# Patient Record
Sex: Female | Born: 1950 | Race: White | Hispanic: No | Marital: Married | Smoking: Never smoker
Health system: Southern US, Community
[De-identification: ages and names within clinical notes are randomized; demographics above are authoritative.]

## PROBLEM LIST (undated history)

## (undated) DIAGNOSIS — R51 Headache: Secondary | ICD-10-CM

## (undated) DIAGNOSIS — Z8739 Personal history of other diseases of the musculoskeletal system and connective tissue: Secondary | ICD-10-CM

## (undated) DIAGNOSIS — G43909 Migraine, unspecified, not intractable, without status migrainosus: Secondary | ICD-10-CM

## (undated) DIAGNOSIS — I00 Rheumatic fever without heart involvement: Secondary | ICD-10-CM

## (undated) DIAGNOSIS — N02B9 Other recurrent and persistent immunoglobulin A nephropathy: Secondary | ICD-10-CM

## (undated) DIAGNOSIS — B1910 Unspecified viral hepatitis B without hepatic coma: Secondary | ICD-10-CM

## (undated) DIAGNOSIS — K219 Gastro-esophageal reflux disease without esophagitis: Secondary | ICD-10-CM

## (undated) DIAGNOSIS — R011 Cardiac murmur, unspecified: Secondary | ICD-10-CM

## (undated) DIAGNOSIS — R519 Headache, unspecified: Secondary | ICD-10-CM

## (undated) DIAGNOSIS — M1990 Unspecified osteoarthritis, unspecified site: Secondary | ICD-10-CM

## (undated) DIAGNOSIS — N028 Recurrent and persistent hematuria with other morphologic changes: Secondary | ICD-10-CM

## (undated) DIAGNOSIS — F419 Anxiety disorder, unspecified: Secondary | ICD-10-CM

## (undated) DIAGNOSIS — I10 Essential (primary) hypertension: Secondary | ICD-10-CM

## (undated) HISTORY — PX: ERCP: SHX60

## (undated) HISTORY — PX: LAPAROSCOPIC CHOLECYSTECTOMY: SUR755

## (undated) HISTORY — PX: DILATION AND CURETTAGE OF UTERUS: SHX78

---
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
## 2012-12-31 ENCOUNTER — Emergency Department (HOSPITAL_COMMUNITY)
Admission: EM | Admit: 2012-12-31 | Discharge: 2012-12-31 | Disposition: A | Discharge status: Home / Self Care | Payer: BC Managed Care – PPO | Attending: Emergency Medicine | Admitting: Emergency Medicine

## 2012-12-31 ENCOUNTER — Emergency Department (HOSPITAL_COMMUNITY): Payer: BC Managed Care – PPO

## 2012-12-31 ENCOUNTER — Encounter (HOSPITAL_COMMUNITY): Payer: Self-pay | Admitting: Emergency Medicine

## 2012-12-31 DIAGNOSIS — I10 Essential (primary) hypertension: Secondary | ICD-10-CM | POA: Insufficient documentation

## 2012-12-31 DIAGNOSIS — R002 Palpitations: Secondary | ICD-10-CM | POA: Insufficient documentation

## 2012-12-31 DIAGNOSIS — Z79899 Other long term (current) drug therapy: Secondary | ICD-10-CM | POA: Insufficient documentation

## 2012-12-31 DIAGNOSIS — Z87448 Personal history of other diseases of urinary system: Secondary | ICD-10-CM | POA: Insufficient documentation

## 2012-12-31 DIAGNOSIS — Z8679 Personal history of other diseases of the circulatory system: Secondary | ICD-10-CM | POA: Insufficient documentation

## 2012-12-31 HISTORY — DX: Other recurrent and persistent immunoglobulin A nephropathy: N02.B9

## 2012-12-31 HISTORY — DX: Essential (primary) hypertension: I10

## 2012-12-31 HISTORY — DX: Rheumatic fever without heart involvement: I00

## 2012-12-31 HISTORY — DX: Recurrent and persistent hematuria with other morphologic changes: N02.8

## 2012-12-31 LAB — URINE MICROSCOPIC-ADD ON

## 2012-12-31 LAB — BASIC METABOLIC PANEL
BUN: 14 mg/dL (ref 6–23)
CO2: 28 mEq/L (ref 19–32)
Calcium: 9.6 mg/dL (ref 8.4–10.5)
Chloride: 98 mEq/L (ref 96–112)
Creatinine, Ser: 0.56 mg/dL (ref 0.50–1.10)
GFR calc Af Amer: >90 mL/min (ref >90)
GFR calc non Af Amer: >90 mL/min (ref >90)
Glucose, Bld: 132 mg/dL — ABNORMAL HIGH (ref 70–99)
Potassium: 3.7 mEq/L (ref 3.5–5.1)
Sodium: 137 mEq/L (ref 135–145)

## 2012-12-31 LAB — CBC
HCT: 42.2 % (ref 36.0–46.0)
Hemoglobin: 14.7 g/dL (ref 12.0–15.0)
MCH: 30.7 pg (ref 26.0–34.0)
MCHC: 34.8 g/dL (ref 30.0–36.0)
MCV: 88.1 fL (ref 78.0–100.0)
Platelets: 208 10*3/uL (ref 150–400)
RBC: 4.79 MIL/uL (ref 3.87–5.11)
RDW: 12.7 % (ref 11.5–15.5)
WBC: 9.7 10*3/uL (ref 4.0–10.5)

## 2012-12-31 LAB — URINALYSIS, ROUTINE W REFLEX MICROSCOPIC
Bilirubin Urine: NEGATIVE
Glucose, UA: NEGATIVE mg/dL
Ketones, ur: NEGATIVE mg/dL
Leukocytes, UA: NEGATIVE
Nitrite: NEGATIVE
Protein, ur: 100 mg/dL — AB
Specific Gravity, Urine: 1.006 (ref 1.005–1.030)
Urobilinogen, UA: 0.2 mg/dL (ref 0.0–1.0)
pH: 7.5 (ref 5.0–8.0)

## 2012-12-31 NOTE — ED Notes (Signed)
Pt from home c/o HTN. 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 Has had recent fluctuation in BP last 2 weeks. This AM highest BP 208/84. Pt has taken multiple Medication to lower BP. Denies Pain, headache, N/V

## 2013-01-02 NOTE — ED Provider Notes (Signed)
History     CSN: [id number]  Arrival date & time 12/31/12  4098   First MD Initiated Contact with Patient 12/31/12 [phone]      Chief Complaint  Patient presents with  . Hypertension    (Consider location/radiation/quality/duration/timing/severity/associated sxs/prior treatment) HPI Patient presents emergency Department with heart palpitations, and high blood pressure.  Patient, states she's been having fluctuations in her blood pressure over the last several weeks.  Patient, states she's not had chest pain, shortness of breath, nausea, vomiting, diarrhea, headache, visual changes, decreased urine output, numbness, syncope, or dizziness.  Patient, states, that these extra beat/palpitations seemed to wake her up at night.  She states that when she was taking her bystolic at bedtime she did not have this issue.  Her doctor changed some of her medications and times for her medication administration.  Patient, states that she came to [place] from [place], [place], do to concerns for the ER in [place].  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  Past Medical History  Diagnosis Date  . IgAN (IgA nephropathy)   . Hypertension   . Rheumatic fever     Past Surgical History  Procedure Laterality Date  . Cholecystectomy    . Ercp      History reviewed. No pertinent family history.  History  Substance Use Topics  . Smoking status: Never Smoker   . Smokeless tobacco: Never Used  . Alcohol Use: No    OB History   Grav Para Term Preterm Abortions TAB SAB Ect Mult Living                  Review of Systems All other systems negative except as documented in the HPI. All pertinent positives and negatives as reviewed in the HPI.  Allergies  Flagyl; Statins; and Sulfa antibiotics  Home Medications   Current Outpatient Rx  Name  Route  Sig  Dispense  Refill  . acetaminophen (TYLENOL) 500 MG tablet   Oral   Take 1,000  mg by mouth every 6 (six) hours as needed for pain.         Marland Kitchen b complex vitamins tablet   Oral   Take 1 tablet by mouth daily.         . benazepril (LOTENSIN) 40 MG tablet   Oral   Take 40 mg by mouth daily.         . cloNIDine (CATAPRES) 0.1 MG tablet   Oral   Take 0.1 mg by mouth 2 (two) times daily.         . Coenzyme Q10 (COQ-10) 200 MG CAPS   Oral   Take 1 capsule by mouth daily.         Marland Kitchen CRANBERRY PO   Oral   Take 1 tablet by mouth daily.         . hydrochlorothiazide (HYDRODIURIL) 50 MG tablet   Oral   Take 50 mg by mouth once.         Marland Kitchen LORazepam (ATIVAN) 0.5 MG tablet   Oral   Take 0.5 mg by mouth 2 (two) times daily as needed for anxiety.         Marland Kitchen losartan-hydrochlorothiazide (HYZAAR) 100-25 MG per tablet   Oral   Take 1 tablet by mouth daily.         . magnesium oxide (MAG-OX) 400 MG tablet   Oral   Take 400 mg by mouth daily.         Marland Kitchen  nebivolol (BYSTOLIC) 5 MG tablet   Oral   Take 5 mg by mouth daily.         Marland Kitchen NIFEdipine (PROCARDIA-XL/ADALAT-CC/NIFEDICAL-XL) 30 MG 24 hr tablet   Oral   Take 60 mg by mouth once.         Marland Kitchen omega-3 acid ethyl esters (LOVAZA) 1 G capsule   Oral   Take 6 g by mouth 2 (two) times daily.         . Vitamin D, Ergocalciferol, (DRISDOL) 50000 UNITS CAPS   Oral   Take 50,000 Units by mouth every 7 (seven) days. Takes on Wednesday         . vitamin E 400 UNIT capsule   Oral   Take 400 Units by mouth daily.           BP 126/60  Pulse 68  Temp(Src) 97.9 F (36.6 C) (Oral)  Resp 18  SpO2 97%  Physical Exam  Nursing note and vitals reviewed. Constitutional: She is oriented to person, place, and time. She appears well-developed and well-nourished. No distress.  HENT:  Head: Normocephalic and atraumatic.  Mouth/Throat: Oropharynx is clear and moist. No oropharyngeal exudate.  Eyes: Pupils are equal, round, and reactive to light.  Neck: Normal range of motion. Neck supple.   Cardiovascular: Normal rate, regular rhythm, normal heart sounds, intact distal pulses and normal pulses.  Frequent extrasystoles are present. Exam reveals no gallop and no friction rub.   No murmur heard. Pulmonary/Chest: Effort normal and breath sounds normal. No respiratory distress.  Abdominal: Soft. Bowel sounds are normal. She exhibits no distension. There is no tenderness.  Neurological: She is alert and oriented to person, place, and time. No cranial nerve deficit. She exhibits normal muscle tone. Coordination normal.  Skin: Skin is warm and dry. She is not diaphoretic.    ED Course  Procedures (including critical care time)  Labs Reviewed  BASIC METABOLIC PANEL - Abnormal; Notable for the following:    Glucose, Bld 132 (*)    All other components within normal limits  URINALYSIS, ROUTINE W REFLEX MICROSCOPIC - Abnormal; Notable for the following:    Hgb urine dipstick TRACE (*)    Protein, ur 100 (*)    All other components within normal limits  CBC  URINE MICROSCOPIC-ADD ON   Dg Chest 2 View  12/31/2012  *RADIOLOGY REPORT*  Clinical Data: Heart palpitations; hypertension  CHEST - 2 VIEW  Comparison: None.  Findings: Lungs clear.  Heart size and pulmonary vascularity are normal.  No adenopathy.  There is degenerative change in the thoracic spine.  IMPRESSION: No edema or consolidation.   Original Report Authenticated By: Bretta Bang, M.D.      1. Hypertension   2. Palpitations     Filed Vitals:   12/31/12 0936 12/31/12 1000 12/31/12 1100 12/31/12 1209  BP: 120/42 113/61 123/62 126/60  Pulse: 69 68 65 68  Temp:    97.9 F (36.6 C)  TempSrc:    Oral  Resp: 20   18  SpO2: 97% 95% 97% 97%   I spoke with the patient at great length about followup with cardiology and her primary doctor about these issues with her blood pressure.  Also advised the patient that she should only take her blood pressure one to 2 times a day at different times.  Patient is advised that the  more she takes her blood pressure more tends to go up.  The patient's blood pressure here has stabilized and then  within normal ranges over the last several hour.  Patient is advised to return here for any worsening in her condition.  The patient has been stable here in the emergency department spoke with cardiology about the patient and they reviewed her chart and findings.  They said they will see her in their office in the next one to 2 days. The patient has no chest pain or shortness of breath. The patient does appear anxious about her blood pressure. The patient is waking up at night with palpitations and checking her BP. She also is adding doses of BP meds. I advised that she needs to consistantly take her medications and not take it randomly and more than prescribed.  MDM  MDM Reviewed: nursing note and vitals Interpretation: labs, ECG and x-ray    Date: 01/05/2013  Rate:78  Rhythm: normal sinus rhythm  QRS Axis: normal  Intervals: normal  ST/T Wave abnormalities: normal  Conduction Disutrbances:first-degree A-V block , left anterior fascicular block and PVCs  Narrative Interpretation:   Old EKG Reviewed: none available           Carlyle Dolly, PA-C 01/05/13 1037

## 2013-01-03 ENCOUNTER — Encounter (HOSPITAL_COMMUNITY): Payer: Self-pay | Admitting: *Deleted

## 2013-01-03 ENCOUNTER — Observation Stay (HOSPITAL_COMMUNITY)
Admission: EM | Admit: 2013-01-03 | Discharge: 2013-01-05 | Disposition: A | Discharge status: Home / Self Care | Payer: BC Managed Care – PPO | Attending: Family Medicine | Admitting: Family Medicine

## 2013-01-03 ENCOUNTER — Other Ambulatory Visit: Payer: Self-pay

## 2013-01-03 DIAGNOSIS — R1013 Epigastric pain: Secondary | ICD-10-CM | POA: Insufficient documentation

## 2013-01-03 DIAGNOSIS — Z8679 Personal history of other diseases of the circulatory system: Secondary | ICD-10-CM | POA: Diagnosis present

## 2013-01-03 DIAGNOSIS — I160 Hypertensive urgency: Secondary | ICD-10-CM | POA: Diagnosis present

## 2013-01-03 DIAGNOSIS — Z79899 Other long term (current) drug therapy: Secondary | ICD-10-CM | POA: Insufficient documentation

## 2013-01-03 DIAGNOSIS — K3189 Other diseases of stomach and duodenum: Secondary | ICD-10-CM | POA: Insufficient documentation

## 2013-01-03 DIAGNOSIS — R5383 Other fatigue: Secondary | ICD-10-CM | POA: Insufficient documentation

## 2013-01-03 DIAGNOSIS — N059 Unspecified nephritic syndrome with unspecified morphologic changes: Secondary | ICD-10-CM | POA: Insufficient documentation

## 2013-01-03 DIAGNOSIS — I10 Essential (primary) hypertension: Secondary | ICD-10-CM | POA: Insufficient documentation

## 2013-01-03 DIAGNOSIS — R079 Chest pain, unspecified: Principal | ICD-10-CM | POA: Insufficient documentation

## 2013-01-03 DIAGNOSIS — Z8619 Personal history of other infectious and parasitic diseases: Secondary | ICD-10-CM

## 2013-01-03 DIAGNOSIS — R5381 Other malaise: Secondary | ICD-10-CM | POA: Insufficient documentation

## 2013-01-03 LAB — COMPREHENSIVE METABOLIC PANEL
Alkaline Phosphatase: 49 U/L (ref 39–117)
BUN: 22 mg/dL (ref 6–23)
GFR calc Af Amer: >90 mL/min (ref >90)
Glucose, Bld: 118 mg/dL — ABNORMAL HIGH (ref 70–99)
Potassium: 3.9 mEq/L (ref 3.5–5.1)
Total Protein: 7.5 g/dL (ref 6.0–8.3)

## 2013-01-03 LAB — PRO B NATRIURETIC PEPTIDE: Pro B Natriuretic peptide (BNP): 148.8 pg/mL — ABNORMAL HIGH (ref 0–125)

## 2013-01-03 LAB — POCT I-STAT TROPONIN I: Troponin i, poc: 0 ng/mL (ref 0.00–0.08)

## 2013-01-03 LAB — CBC
HCT: 42.6 % (ref 36.0–46.0)
Hemoglobin: 15 g/dL (ref 12.0–15.0)
MCH: 30.7 pg (ref 26.0–34.0)
MCHC: 35.2 g/dL (ref 30.0–36.0)

## 2013-01-03 MED ORDER — ASPIRIN 81 MG PO CHEW
324.0000 mg | CHEWABLE_TABLET | Freq: Once | ORAL | Status: AC
  Administered 2013-01-03: 324 mg via ORAL
  Filled 2013-01-03: qty 4

## 2013-01-03 NOTE — ED Provider Notes (Signed)
History     CSN: [id number]  Arrival date & time 01/03/13  2249   First MD Initiated Contact with Patient 01/03/13 2323      Chief Complaint  Patient presents with  . Chest Pain  . Dizziness  . Shortness of Breath    (Consider location/radiation/quality/duration/timing/severity/associated sxs/prior treatment) HPI 62 year old female presents to emergency department with complaint of elevated blood pressure, intermittent chest pressure and pain throughout the day, shortness of breath. Patient was seen in the emergency department 3 days ago for complaint of elevated blood pressure. She and her husband report her blood pressure has been well managed until December, when she began to have palpitations. Since that time her nephrologist has been rearranging her blood pressure medicines.she was recommended to follow-up with a cardiologist, and has an appointment March 5. She reports today around 3 PM she had sharp stabbing pains in her left chest associated with a fullness feeling. She also complains of intermittent chest pressure throughout the day. Patient reports intermittent hot feeling and tightness. Around 9 PM, patient with worsening tightness in her chest. In route here, she had onset of left flank pain and left neck pain radiating into her jaw. No pain at present. Patient has a half-brother is history of coronary disease.  She does not smoke.  Patient has history of hypertension, and IgA nephropathy. Past Medical History  Diagnosis Date  . IgAN (IgA nephropathy)   . Hypertension   . Rheumatic fever     Past Surgical History  Procedure Laterality Date  . Cholecystectomy    . Ercp      No family history on file.  History  Substance Use Topics  . Smoking status: Never Smoker   . Smokeless tobacco: Never Used  . Alcohol Use: No    OB History   Grav Para Term Preterm Abortions TAB SAB Ect Mult Living                  Review of Systems  Constitutional: Positive for fatigue.  Negative for diaphoresis.  Respiratory: Positive for chest tightness and shortness of breath. Negative for cough, choking and wheezing.   Neurological: Positive for dizziness and light-headedness.  All other systems reviewed and are negative.    Allergies  Flagyl; Statins; and Sulfa antibiotics  Home Medications   Current Outpatient Rx  Name  Route  Sig  Dispense  Refill  . b complex vitamins tablet   Oral   Take 1 tablet by mouth daily.         . benazepril (LOTENSIN) 40 MG tablet   Oral   Take 40 mg by mouth daily.         . cloNIDine (CATAPRES) 0.1 MG tablet   Oral   Take 0.1 mg by mouth 2 (two) times daily.         . Coenzyme Q10 (COQ-10) 200 MG CAPS   Oral   Take 1 capsule by mouth daily.         Marland Kitchen CRANBERRY PO   Oral   Take 1 tablet by mouth daily.         . hydrALAZINE (APRESOLINE) 25 MG tablet   Oral   Take 25 mg by mouth 2 (two) times daily.         Marland Kitchen losartan-hydrochlorothiazide (HYZAAR) 100-25 MG per tablet   Oral   Take 1 tablet by mouth daily.         . Magnesium 250 MG TABS   Oral  Take 250 mg by mouth 2 (two) times daily.         . nebivolol (BYSTOLIC) 5 MG tablet   Oral   Take 5 mg by mouth daily.         Marland Kitchen NIFEdipine (PROCARDIA-XL/ADALAT-CC/NIFEDICAL-XL) 30 MG 24 hr tablet   Oral   Take 60 mg by mouth once.         Marland Kitchen omega-3 acid ethyl esters (LOVAZA) 1 G capsule   Oral   Take 6 g by mouth 2 (two) times daily.         Marland Kitchen OVER THE COUNTER MEDICATION   Oral   Take 450 mg by mouth daily. Tumeric         . Vitamin D, Ergocalciferol, (DRISDOL) 50000 UNITS CAPS   Oral   Take 50,000 Units by mouth every 7 (seven) days. Takes on Wednesday         . vitamin E 400 UNIT capsule   Oral   Take 400 Units by mouth daily.           BP 182/79  Pulse 71  Temp(Src) 98.2 F (36.8 C) (Oral)  Resp 16  SpO2 97%  Physical Exam  Nursing note and vitals reviewed. Constitutional: She is oriented to person, place, and  time. She appears distressed (anxious appearing).  Obese female  HENT:  Head: Normocephalic and atraumatic.  Nose: Nose normal.  Mouth/Throat: Oropharynx is clear and moist.  Eyes: Conjunctivae and EOM are normal. Pupils are equal, round, and reactive to light.  Neck: Normal range of motion. Neck supple. No JVD present. No tracheal deviation present. No thyromegaly present.  Cardiovascular: Normal rate, regular rhythm, normal heart sounds and intact distal pulses.  Exam reveals no gallop and no friction rub.   No murmur heard. Hypertension noted  Pulmonary/Chest: Effort normal and breath sounds normal. No stridor. No respiratory distress. She has no wheezes. She has no rales. She exhibits no tenderness.  Abdominal: Soft. Bowel sounds are normal. She exhibits no distension and no mass. There is no tenderness. There is no rebound and no guarding.  Musculoskeletal: Normal range of motion. She exhibits no edema and no tenderness.  Lymphadenopathy:    She has no cervical adenopathy.  Neurological: She is alert and oriented to person, place, and time. She exhibits normal muscle tone. Coordination normal.  Skin: Skin is warm and dry. No rash noted. She is not diaphoretic. No erythema. No pallor.  Psychiatric:  anxious    ED Course  Procedures (including critical care time)  Labs Reviewed  CBC - Abnormal; Notable for the following:    WBC 10.6 (*)    All other components within normal limits  PRO B NATRIURETIC PEPTIDE  COMPREHENSIVE METABOLIC PANEL  POCT I-STAT TROPONIN I   Dg Chest 2 View  01/04/2013  *RADIOLOGY REPORT*  Clinical Data: Chest pain, shortness of breath  CHEST - 2 VIEW  Comparison: 12/31/2012  Findings: Unchanged cardiomediastinal contours, within normal range.  No confluent airspace opacity. Minimal linear opacity at the left lung base, in keeping with atelectasis. No pleural effusion or pneumothorax.  Multilevel degenerative changes.  IMPRESSION: No acute process identified  by radiograph.   Original Report Authenticated By: Jearld Lesch, M.D.      Date: 01/03/2013  Rate:79  Rhythm: normal sinus rhythm  QRS Axis: left  Intervals: PR prolonged  ST/T Wave abnormalities: normal  Conduction Disutrbances:first-degree A-V block  and left anterior fascicular block  Narrative Interpretation: LVH, pvcs resolved from  prior  Old EKG Reviewed: changes noted    1. Chest pain radiating to jaw   2. Hypertension       MDM  62 year old female with difficult to control hypertension, today with chest pains. EKG without ST elevation. EKG during last visit did have several PVCs, none seen today. Patient on multiple blood pressure medicationshim a currently weaning off clonidine but taking  Both an ACE inhibitor and an ARB along with hydrochlorothiazide, bistolic and nifedipine.      1:12 AM Patient reports pain has resolved. She reports symptoms improved after nitroglycerin. Blood pressure is much improved. Will discuss with hospitalist for chest pain rule out admission.  Olivia Mackie, MD 01/04/13 [id number]

## 2013-01-03 NOTE — ED Notes (Addendum)
~   1 hr. Lt. Side Lower back pain, then started having Lt. Side cp, diaphoresis, sob, dizzy. D/c from this hospital on Monday for having flushed feeling, and shakiness. "feels like heart was going to stop."  BP 200/90. Taking Claritin for 3 weeks. Took bystolic an hour ago.

## 2013-01-04 ENCOUNTER — Encounter (HOSPITAL_COMMUNITY): Payer: Self-pay | Admitting: Internal Medicine

## 2013-01-04 ENCOUNTER — Emergency Department (HOSPITAL_COMMUNITY): Payer: BC Managed Care – PPO

## 2013-01-04 DIAGNOSIS — I359 Nonrheumatic aortic valve disorder, unspecified: Secondary | ICD-10-CM

## 2013-01-04 DIAGNOSIS — I10 Essential (primary) hypertension: Secondary | ICD-10-CM | POA: Diagnosis present

## 2013-01-04 DIAGNOSIS — Z8679 Personal history of other diseases of the circulatory system: Secondary | ICD-10-CM | POA: Diagnosis present

## 2013-01-04 DIAGNOSIS — I160 Hypertensive urgency: Secondary | ICD-10-CM | POA: Diagnosis present

## 2013-01-04 LAB — BASIC METABOLIC PANEL
BUN: 19 mg/dL (ref 6–23)
Chloride: 98 mEq/L (ref 96–112)
Creatinine, Ser: 0.54 mg/dL (ref 0.50–1.10)
GFR calc Af Amer: >90 mL/min (ref >90)
Glucose, Bld: 139 mg/dL — ABNORMAL HIGH (ref 70–99)

## 2013-01-04 LAB — CREATININE, SERUM
GFR calc Af Amer: >90 mL/min (ref >90)
GFR calc non Af Amer: >90 mL/min (ref >90)

## 2013-01-04 LAB — TSH: TSH: 1.578 u[IU]/mL (ref 0.350–4.500)

## 2013-01-04 LAB — CBC
HCT: 40.9 % (ref 36.0–46.0)
HCT: 41.6 % (ref 36.0–46.0)
MCHC: 33.9 g/dL (ref 30.0–36.0)
MCV: 87.9 fL (ref 78.0–100.0)
Platelets: 240 10*3/uL (ref 150–400)
RBC: 4.72 MIL/uL (ref 3.87–5.11)
RDW: 12.5 % (ref 11.5–15.5)
RDW: 12.8 % (ref 11.5–15.5)
WBC: 11.7 10*3/uL — ABNORMAL HIGH (ref 4.0–10.5)

## 2013-01-04 LAB — HEMOGLOBIN A1C: Mean Plasma Glucose: 120 mg/dL — ABNORMAL HIGH (ref <117)

## 2013-01-04 LAB — GLUCOSE, CAPILLARY: Glucose-Capillary: 104 mg/dL — ABNORMAL HIGH (ref 70–99)

## 2013-01-04 MED ORDER — LOSARTAN POTASSIUM-HCTZ 100-25 MG PO TABS: 1.0000 | ORAL_TABLET | Freq: Every day | ORAL | Status: DC

## 2013-01-04 MED ORDER — LORAZEPAM 1 MG PO TABS
1.0000 mg | ORAL_TABLET | Freq: Once | ORAL | Status: AC
  Administered 2013-01-04: 1 mg via ORAL
  Filled 2013-01-04: qty 1

## 2013-01-04 MED ORDER — OMEGA-3-ACID ETHYL ESTERS 1 G PO CAPS
2.0000 g | ORAL_CAPSULE | Freq: Two times a day (BID) | ORAL | Status: DC
  Administered 2013-01-04 – 2013-01-05 (×3): 2 g via ORAL
  Filled 2013-01-04 (×4): qty 2

## 2013-01-04 MED ORDER — NEBIVOLOL HCL 5 MG PO TABS
5.0000 mg | ORAL_TABLET | Freq: Every day | ORAL | Status: DC
  Administered 2013-01-04 – 2013-01-05 (×2): 5 mg via ORAL
  Filled 2013-01-04 (×2): qty 1

## 2013-01-04 MED ORDER — BENAZEPRIL HCL 40 MG PO TABS
40.0000 mg | ORAL_TABLET | Freq: Every day | ORAL | Status: DC
  Administered 2013-01-04: 40 mg via ORAL
  Filled 2013-01-04: qty 1

## 2013-01-04 MED ORDER — NIFEDIPINE ER 60 MG PO TB24
60.0000 mg | ORAL_TABLET | Freq: Once | ORAL | Status: DC
  Filled 2013-01-04: qty 1

## 2013-01-04 MED ORDER — CLONIDINE HCL 0.1 MG PO TABS
0.1000 mg | ORAL_TABLET | Freq: Two times a day (BID) | ORAL | Status: DC
  Administered 2013-01-04 (×2): 0.1 mg via ORAL
  Filled 2013-01-04 (×4): qty 1

## 2013-01-04 MED ORDER — VITAMIN E 180 MG (400 UNIT) PO CAPS
400.0000 [IU] | ORAL_CAPSULE | Freq: Every day | ORAL | Status: DC
  Administered 2013-01-04 – 2013-01-05 (×2): 400 [IU] via ORAL
  Filled 2013-01-04 (×2): qty 1

## 2013-01-04 MED ORDER — HYDRALAZINE HCL 25 MG PO TABS
25.0000 mg | ORAL_TABLET | Freq: Two times a day (BID) | ORAL | Status: DC
  Administered 2013-01-04: 25 mg via ORAL
  Filled 2013-01-04 (×2): qty 1

## 2013-01-04 MED ORDER — NITROGLYCERIN 0.4 MG SL SUBL
0.4000 mg | SUBLINGUAL_TABLET | SUBLINGUAL | Status: DC | PRN
  Administered 2013-01-04: 0.4 mg via SUBLINGUAL

## 2013-01-04 MED ORDER — MAGNESIUM 250 MG PO TABS: 250.0000 mg | ORAL_TABLET | Freq: Two times a day (BID) | ORAL | Status: DC

## 2013-01-04 MED ORDER — SODIUM CHLORIDE 0.9 % IJ SOLN
3.0000 mL | Freq: Two times a day (BID) | INTRAMUSCULAR | Status: DC
  Administered 2013-01-04: 3 mL via INTRAVENOUS

## 2013-01-04 MED ORDER — HEPARIN SODIUM (PORCINE) 5000 UNIT/ML IJ SOLN
5000.0000 [IU] | Freq: Three times a day (TID) | INTRAMUSCULAR | Status: DC
  Administered 2013-01-04 – 2013-01-05 (×4): 5000 [IU] via SUBCUTANEOUS
  Filled 2013-01-04 (×7): qty 1

## 2013-01-04 MED ORDER — MAGNESIUM OXIDE 400 (241.3 MG) MG PO TABS
200.0000 mg | ORAL_TABLET | Freq: Every day | ORAL | Status: DC
  Administered 2013-01-04: 10:00:00 via ORAL
  Administered 2013-01-05: 200 mg via ORAL
  Filled 2013-01-04 (×2): qty 0.5

## 2013-01-04 MED ORDER — COQ-10 200 MG PO CAPS: 1.0000 | ORAL_CAPSULE | Freq: Every day | ORAL | Status: DC

## 2013-01-04 MED ORDER — B COMPLEX PO TABS: 1.0000 | ORAL_TABLET | Freq: Every day | ORAL | Status: DC

## 2013-01-04 MED ORDER — LOSARTAN POTASSIUM 50 MG PO TABS
100.0000 mg | ORAL_TABLET | Freq: Every day | ORAL | Status: DC
  Administered 2013-01-04 – 2013-01-05 (×2): 100 mg via ORAL
  Filled 2013-01-04 (×2): qty 2

## 2013-01-04 MED ORDER — CLONIDINE HCL 0.1 MG PO TABS
0.1000 mg | ORAL_TABLET | Freq: Two times a day (BID) | ORAL | Status: DC
  Filled 2013-01-04 (×3): qty 1

## 2013-01-04 MED ORDER — VITAMIN D (ERGOCALCIFEROL) 1.25 MG (50000 UNIT) PO CAPS: 50000.0000 [IU] | ORAL_CAPSULE | ORAL | Status: DC

## 2013-01-04 MED ORDER — CLONIDINE HCL 0.1 MG PO TABS: 0.1000 mg | ORAL_TABLET | Freq: Every day | ORAL | Status: DC

## 2013-01-04 MED ORDER — HYDROCHLOROTHIAZIDE 25 MG PO TABS
25.0000 mg | ORAL_TABLET | Freq: Every day | ORAL | Status: DC
  Administered 2013-01-04 – 2013-01-05 (×2): 25 mg via ORAL
  Filled 2013-01-04 (×2): qty 1

## 2013-01-04 MED ORDER — OMEGA-3-ACID ETHYL ESTERS 1 G PO CAPS: 6.0000 g | ORAL_CAPSULE | Freq: Two times a day (BID) | ORAL | Status: DC

## 2013-01-04 MED ORDER — B COMPLEX-C PO TABS
1.0000 | ORAL_TABLET | Freq: Every day | ORAL | Status: DC
  Administered 2013-01-04: 1 via ORAL
  Filled 2013-01-04 (×2): qty 1

## 2013-01-04 NOTE — Progress Notes (Signed)
4:07 PM I agree with HPI/GPe and A/P per Dr. Anthoney Harada  Patient states she has had some CP for the past day.  When asked specifics, it is in the ant chest with no radiaiton.  When she stands and ambulates about 5 feet, she seems to feel [name] is going to pass out and has some "tingling" in the LE's upto her chest She reports she has had hi and low blood pressures but rec3ently has felt somewhat light headed at home      HEENT-Morbid obesity, Body mass index is 49.72 kg/(m^2). CHEST-clear no added sound CARDIAC-s1 s2 no m/r/g, tele ABDOMEN-Soft, NT/ND NEURO-intact   Patient Active Problem List  Diagnosis  . Chest pain  . Hypertension  . Hypertensive urgency  . History of rheumatic fever    1. CP-Unlikely cardiogenic.  ECHO neg, Troponin's neg so far.  Continue with out-patient stress scheduled for Mar 5th 2. ?Orthostatic hypotension-on multiple medications-weaning off of Clonidine per nephrologist-will discontinue benazepril and hydralazine for now.  Would cover with PRN IV hydralazine IV in addition if blood pressure above 200/100 3. H/o IGA nephropathy-outpatient nephrology follow-up

## 2013-01-04 NOTE — Progress Notes (Signed)
*  PRELIMINARY RESULTS* Echocardiogram 2D Echocardiogram has been performed.  Jeryl Columbia 01/04/2013, 9:09 AM

## 2013-01-04 NOTE — H&P (Signed)
[facility] History and Physical  [name] GNF:[id number] DOB: 08/01/1951 DOA: 01/03/2013  Referring physician: ED PCP: No primary provider on file.  Specialists: Has appointment with cardiologist on March 5th  Chief Complaint: Chest pain  HPI: [name] is a 62 y.o. female who presented to the ED with c/o elevated blood pressure and intermittently occuring chest pressure and pain occuring throughout the day onset at 3 PM.  Her chest pain was located in her left chest, radiating to her L jaw and neck.  Sharp and stabbing in quality.  Her blood pressure had been well controlled until December when she began to have palpitations, since that time her nephrologist (she sees for h/o IgA nephropathy) has been rearranging her BP meds (currently she is coming off of clonidine and going on to hydralazine).  In the ED, first set of troponin were negative, hospitalist has been asked to admit as chest pain rule out cardiac.  Review of Systems: chest pain resolved at this point, 12 systems reviewed and otherwise negative.  Past Medical History  Diagnosis Date  . IgAN (IgA nephropathy)   . Hypertension   . Rheumatic fever    Past Surgical History  Procedure Laterality Date  . Cholecystectomy    . Ercp     Social History:  reports that she has never smoked. She has never used smokeless tobacco. She reports that she does not drink alcohol. Her drug history is not on file.  Allergies  Allergen Reactions  . Flagyl (Metronidazole) Nausea And Vomiting  . Statins Other (See Comments)    Muscle weakness in arms  . Sulfa Antibiotics Rash    Turns red    Family History  Problem Relation Age of Onset  . Coronary artery disease Other     Half brother    Prior to Admission medications   Medication Sig Start Date End Date Taking? Authorizing Provider  b complex vitamins tablet Take 1 tablet by mouth daily.   Yes Historical Provider, MD  benazepril (LOTENSIN) 40 MG tablet Take  40 mg by mouth daily.   Yes Historical Provider, MD  cloNIDine (CATAPRES) 0.1 MG tablet Take 0.1 mg by mouth 2 (two) times daily.   Yes Historical Provider, MD  Coenzyme Q10 (COQ-10) 200 MG CAPS Take 1 capsule by mouth daily.   Yes Historical Provider, MD  CRANBERRY PO Take 1 tablet by mouth daily.   Yes Historical Provider, MD  hydrALAZINE (APRESOLINE) 25 MG tablet Take 25 mg by mouth 2 (two) times daily.   Yes Historical Provider, MD  losartan-hydrochlorothiazide (HYZAAR) 100-25 MG per tablet Take 1 tablet by mouth daily.   Yes Historical Provider, MD  Magnesium 250 MG TABS Take 250 mg by mouth 2 (two) times daily.   Yes Historical Provider, MD  nebivolol (BYSTOLIC) 5 MG tablet Take 5 mg by mouth daily.   Yes Historical Provider, MD  NIFEdipine (PROCARDIA-XL/ADALAT-CC/NIFEDICAL-XL) 30 MG 24 hr tablet Take 60 mg by mouth once.   Yes Historical Provider, MD  omega-3 acid ethyl esters (LOVAZA) 1 G capsule Take 6 g by mouth 2 (two) times daily.   Yes Historical Provider, MD  OVER THE COUNTER MEDICATION Take 450 mg by mouth daily. Tumeric   Yes Historical Provider, MD  Vitamin D, Ergocalciferol, (DRISDOL) 50000 UNITS CAPS Take 50,000 Units by mouth every 7 (seven) days. Takes on Wednesday   Yes Historical Provider, MD  vitamin E 400 UNIT capsule Take 400 Units by mouth daily.   Yes Historical Provider,  MD   Physical Exam: Filed Vitals:   01/04/13 0100 01/04/13 0108 01/04/13 0130 01/04/13 0200  BP:  134/61 122/62 167/70  Pulse: 62 62 61 66  Temp:  98.7 F (37.1 C)    TempSrc:  Oral    Resp: 17 18 20 16   SpO2: 96% 96% 95% 97%    General:  NAD, resting comfortably in bed Eyes: PEERLA EOMI ENT: mucous membranes moist Neck: supple w/o JVD Cardiovascular: RRR w/o MRG Respiratory: CTA B Abdomen: soft, nt, nd, bs+ Skin: no rash nor lesion Musculoskeletal: MAE, full ROM all 4 extremities Psychiatric: normal tone and affect Neurologic: AAOx3, grossly non-focal   Labs on Admission:  Basic  Metabolic Panel:  Recent Labs Lab 12/31/12 0558 01/03/13 2307  NA 137 137  K 3.7 3.9  CL 98 98  CO2 28 27  GLUCOSE 132* 118*  BUN 14 22  CREATININE 0.56 0.63  CALCIUM 9.6 9.8   Liver Function Tests:  Recent Labs Lab 01/03/13 2307  AST 25  ALT 23  ALKPHOS 49  BILITOT 0.3  PROT 7.5  ALBUMIN 3.4*   No results found for this basename: LIPASE, AMYLASE,  in the last 168 hours No results found for this basename: AMMONIA,  in the last 168 hours CBC:  Recent Labs Lab 12/31/12 0558 01/03/13 2307  WBC 9.7 10.6*  HGB 14.7 15.0  HCT 42.2 42.6  MCV 88.1 87.3  PLT 208 227   Cardiac Enzymes: No results found for this basename: CKTOTAL, CKMB, CKMBINDEX, TROPONINI,  in the last 168 hours  BNP (last 3 results)  Recent Labs  01/03/13 2305  PROBNP 148.8*   CBG: No results found for this basename: GLUCAP,  in the last 168 hours  Radiological Exams on Admission: Dg Chest 2 View  01/04/2013  *RADIOLOGY REPORT*  Clinical Data: Chest pain, shortness of breath  CHEST - 2 VIEW  Comparison: 12/31/2012  Findings: Unchanged cardiomediastinal contours, within normal range.  No confluent airspace opacity. Minimal linear opacity at the left lung base, in keeping with atelectasis. No pleural effusion or pneumothorax.  Multilevel degenerative changes.  IMPRESSION: No acute process identified by radiograph.   Original Report Authenticated By: Jearld Lesch, M.D.     EKG: Independently reviewed.  Assessment/Plan Principal Problem:   Chest pain Active Problems:   Hypertension   Hypertensive urgency   History of rheumatic fever   1. Chest pain and hypertensive urgency - admitting as rule out cardiac and hypertensive urgency also given that her BPs earlier in the day were as high as SBPs of 200.  Continue to monitor her BPs which unfortunately are very labile ranging from the 120s to 170s between any 2 15 min readings right now.  Chest pain has resolved at this point.  Tele monitor,  serial troponins, 2d echo all ordered. 2. HTN - continue home BP meds for now, blood pressures are very labile, likely secondary HTN from her history of IgA nephropathy 3. H/o rheumatic fever - with history of heart murmur as well, will get more information on this with a 2d echo that I have ordered.    Code Status: Full Code (must indicate code status--if unknown or must be presumed, indicate so) Family Communication: Spoke with husband at bedside (indicate person spoken with, if applicable, with phone number if by telephone) Disposition Plan: Admit to inpatient (indicate anticipated LOS)  Time spent: 50 min  GARDNER, JARED M. [facility] Pager [phone]  If 7PM-7AM, please contact night-coverage [email or web address] Password  [email or web address] 01/04/2013, 2:31 AM

## 2013-01-04 NOTE — Discharge Summary (Signed)
Physician Discharge Summary  [name] NGE:[id number] DOB: [birth year] DOA: 01/03/2013  PCP: No primary provider on file.  Admit date: 01/03/2013 Discharge date: 01/04/2013  Time spent: 15 minutes  Recommendations for Outpatient Follow-up:  1. Recommend further out-patient risk factor assessment with stress test  2. 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 Needs weight loss 4. Consider A1c in near future  Discharge Diagnoses:  Principal Problem:   Chest pain Active Problems:   Hypertension   Hypertensive urgency   History of rheumatic fever   Discharge Condition: Well  Diet recommendation: Healthy low-salt  Filed Weights   01/04/13 0330  Weight: 135.535 kg (298 lb 12.8 oz)    History of present illness:  62 year old obese Caucasian female presented to 01/04/2013 with chest pain in the left chart neck sharp and stabbing quality. She said that this is intermittent and not really associated with any activity or food. She has some dyspepsia at baseline.  She was ruled out by cardiac enzymes x3 She developed some vague malaise initially in hospital and was monitored overnight-this was more with ambulation wherein she felt "dizzy"  Orthostatic vital signs were not conclusive for orthostasis but this resolved ultimately on days subsequent to admission She started to develop some R great toe pain and has a h/o gout, therefore her HCTZ was d/c and she was started on Colchicine 0.6 mg bid in hospital and given a prescription Echocardiogram showed EF 60% with grade 1 diastolic dysfunction. Her blood pressure was moderately controlled only and this may because she is being weaned off clonidine with multiple other pressure medications being given. She has a outpatient stress test scheduled on March 5 and I think that is appropriate followup.   Procedures:  Echocardiogram as per  above  Consultations:  none  Discharge Exam: Filed Vitals:   01/04/13 0300 01/04/13 0330 01/04/13 0436 01/04/13 1400  BP: 133/56 175/92 133/86 153/63  Pulse: 62 61 61 63  Temp:  98.7 F (37.1 C)  98.8 F (37.1 C)  TempSrc:  Oral  Oral  Resp: 18 18  18   Height:  5\' 5"  (1.651 m)    Weight:  135.535 kg (298 lb 12.8 oz)    SpO2: 94% 92%  96%   Alert pleasant oriented, some pain in R great toe. Denies cp/n/v/blurred or double vision Weakness   Cardiovascular: s1 s2 no m/r/g Respiratory:  Clinically clear R great toe slightly swollen  Discharge Instructions  Discharge Orders   Future Orders Complete By Expires     Call MD for:  difficulty breathing, headache or visual disturbances  As directed     Call MD for:  hives  As directed     Call MD for:  persistant dizziness or light-headedness  As directed     Call MD for:  severe uncontrolled pain  As directed     Call MD for:  temperature >100.4  As directed     Diet - low sodium heart healthy  As directed     Increase activity slowly  As directed         Medication List    STOP taking these medications       losartan-hydrochlorothiazide 100-25 MG per tablet  Commonly known as:  HYZAAR      TAKE these medications       b complex vitamins tablet  Take 1 tablet by mouth daily.     benazepril 40 MG  tablet  Commonly known as:  LOTENSIN  Take 40 mg by mouth daily.     cloNIDine 0.1 MG tablet  Commonly known as:  CATAPRES  Take 1 tablet (0.1 mg total) by mouth daily.     colchicine 0.6 MG tablet  Take 1 tablet (0.6 mg total) by mouth 2 (two) times daily.     CoQ-10 200 MG Caps  Take 1 capsule by mouth daily.     CRANBERRY PO  Take 1 tablet by mouth daily.     hydrALAZINE 25 MG tablet  Commonly known as:  APRESOLINE  Take 25 mg by mouth 2 (two) times daily.     losartan 100 MG tablet  Commonly known as:  COZAAR  Take 1 tablet (100 mg total) by mouth daily.     Magnesium 250 MG Tabs  Take 250 mg by mouth  2 (two) times daily.     nebivolol 5 MG tablet  Commonly known as:  BYSTOLIC  Take 5 mg by mouth daily.     NIFEdipine 30 MG 24 hr tablet  Commonly known as:  PROCARDIA-XL/ADALAT-CC/NIFEDICAL-XL  Take 60 mg by mouth once.     omega-3 acid ethyl esters 1 G capsule  Commonly known as:  LOVAZA  Take 6 g by mouth 2 (two) times daily.     OVER THE COUNTER MEDICATION  Take 450 mg by mouth daily. Tumeric     Vitamin D (Ergocalciferol) 50000 UNITS Caps  Commonly known as:  DRISDOL  Take 50,000 Units by mouth every 7 (seven) days. Takes on Wednesday     vitamin E 400 UNIT capsule  Take 400 Units by mouth daily.          The results of significant diagnostics from this hospitalization (including imaging, microbiology, ancillary and laboratory) are listed below for reference.    Significant Diagnostic Studies: Dg Chest 2 View  01/04/2013  *RADIOLOGY REPORT*  Clinical Data: Chest pain, shortness of breath  CHEST - 2 VIEW  Comparison: 12/31/2012  Findings: Unchanged cardiomediastinal contours, within normal range.  No confluent airspace opacity. Minimal linear opacity at the left lung base, in keeping with atelectasis. No pleural effusion or pneumothorax.  Multilevel degenerative changes.  IMPRESSION: No acute process identified by radiograph.   Original Report Authenticated By: Jearld Lesch, M.D.    Dg Chest 2 View  12/31/2012  *RADIOLOGY REPORT*  Clinical Data: Heart palpitations; hypertension  CHEST - 2 VIEW  Comparison: None.  Findings: Lungs clear.  Heart size and pulmonary vascularity are normal.  No adenopathy.  There is degenerative change in the thoracic spine.  IMPRESSION: No edema or consolidation.   Original Report Authenticated By: Bretta Bang, M.D.     Microbiology: No results found for this or any previous visit (from the past 240 hour(s)).   Labs: Basic Metabolic Panel:  Recent Labs Lab 12/31/12 0558 01/03/13 2307 01/04/13 0247 01/04/13 0814  NA 137 137   --  136  K 3.7 3.9  --  3.5  CL 98 98  --  98  CO2 28 27  --  26  GLUCOSE 132* 118*  --  139*  BUN 14 22  --  19  CREATININE 0.56 0.63 0.71 0.54  CALCIUM 9.6 9.8  --  9.7   Liver Function Tests:  Recent Labs Lab 01/03/13 2307  AST 25  ALT 23  ALKPHOS 49  BILITOT 0.3  PROT 7.5  ALBUMIN 3.4*   No results found for this basename: LIPASE,  AMYLASE,  in the last 168 hours No results found for this basename: AMMONIA,  in the last 168 hours CBC:  Recent Labs Lab 12/31/12 0558 01/03/13 2307 01/04/13 0247 01/04/13 0814  WBC 9.7 10.6* 11.7* 10.4  HGB 14.7 15.0 14.5 14.1  HCT 42.2 42.6 40.9 41.6  MCV 88.1 87.3 86.7 87.9  PLT 208 227 240 214   Cardiac Enzymes:  Recent Labs Lab 01/04/13 0247 01/04/13 0814  TROPONINI <0.30 <0.30   BNP: BNP (last 3 results)  Recent Labs  01/03/13 2305  PROBNP 148.8*   CBG:  Recent Labs Lab 01/04/13 0739  GLUCAP 104*       Signed:  Rhetta Mura  [facility] 01/04/2013, 3:34 PM

## 2013-01-04 NOTE — Progress Notes (Signed)
Pt got up to ambulate and when she did, she complained of an undescribable feeling of faint, dizziness, legs weak. Orthostatics done lying 153/73  Hr 97, sitting 149/60 92, standing 150/65 96 and 3 minutes after standing 132/99 94. Dr. Mahala Menghini made aware

## 2013-01-04 NOTE — ED Notes (Signed)
Due medication need to be verified by pharmacy.

## 2013-01-05 LAB — BASIC METABOLIC PANEL
Calcium: 9.7 mg/dL (ref 8.4–10.5)
Chloride: 99 mEq/L (ref 96–112)
Creatinine, Ser: 0.64 mg/dL (ref 0.50–1.10)
GFR calc Af Amer: >90 mL/min (ref >90)
GFR calc non Af Amer: >90 mL/min (ref >90)

## 2013-01-05 LAB — GLUCOSE, CAPILLARY: Glucose-Capillary: 97 mg/dL (ref 70–99)

## 2013-01-05 MED ORDER — LOSARTAN POTASSIUM 100 MG PO TABS: 100.0000 mg | ORAL_TABLET | Freq: Every day | ORAL | Status: DC

## 2013-01-05 MED ORDER — COLCHICINE 0.6 MG PO TABS: 0.6000 mg | ORAL_TABLET | Freq: Two times a day (BID) | ORAL | Status: DC

## 2013-01-07 NOTE — ED Provider Notes (Signed)
Medical screening examination/treatment/procedure(s) were performed by non-physician practitioner and as supervising physician I was immediately available for consultation/collaboration.   Richardean Canal, MD 01/07/13 [phone]

## 2013-01-11 ENCOUNTER — Other Ambulatory Visit (HOSPITAL_COMMUNITY): Payer: Self-pay | Admitting: Cardiology

## 2013-01-11 DIAGNOSIS — R079 Chest pain, unspecified: Secondary | ICD-10-CM

## 2013-01-11 DIAGNOSIS — I10 Essential (primary) hypertension: Secondary | ICD-10-CM

## 2013-01-16 ENCOUNTER — Ambulatory Visit (HOSPITAL_COMMUNITY)
Admission: RE | Admit: 2013-01-16 | Discharge: 2013-01-16 | Disposition: A | Discharge status: Home / Self Care | Payer: BC Managed Care – PPO | Source: Ambulatory Visit | Attending: Cardiology | Admitting: Cardiology

## 2013-01-16 DIAGNOSIS — I10 Essential (primary) hypertension: Secondary | ICD-10-CM | POA: Insufficient documentation

## 2013-01-16 DIAGNOSIS — R079 Chest pain, unspecified: Secondary | ICD-10-CM | POA: Insufficient documentation

## 2013-01-16 MED ORDER — TECHNETIUM TC 99M SESTAMIBI GENERIC - CARDIOLITE
11.0000 | Freq: Once | INTRAVENOUS | Status: AC | PRN
  Administered 2013-01-16: 11 via INTRAVENOUS

## 2013-01-16 MED ORDER — REGADENOSON 0.4 MG/5ML IV SOLN
0.4000 mg | Freq: Once | INTRAVENOUS | Status: AC
Start: 2013-01-16 — End: 2013-01-16
  Administered 2013-01-16: 0.4 mg via INTRAVENOUS

## 2013-01-16 MED ORDER — TECHNETIUM TC 99M SESTAMIBI GENERIC - CARDIOLITE
29.2000 | Freq: Once | INTRAVENOUS | Status: AC | PRN
  Administered 2013-01-16: 29.2 via INTRAVENOUS

## 2013-01-16 NOTE — Procedures (Addendum)
 [facility] [address] [place] [address] [place] [address] [phone]  Cardiology Nuclear Med Study  [name] is a 62 y.o. female     MRN : [id number]     DOB: [birth year]  Procedure Date: 01/16/2013  Nuclear Med Background Indication for Stress Test:  Evaluation for Ischemia and Abnormal EKG History:  NO PRIOR CARDIAC HISTORY Cardiac Risk Factors: Family History - CAD, Hypertension and Obesity  Symptoms:  Chest Pain and Fatigue   Nuclear Pre-Procedure Caffeine/Decaff Intake:  12:00am NPO After: 10 AM   IV Site: R Forearm  IV 0.9% NS with Angio Cath:  22g  Chest Size (in):  N/A IV Started by: Emmit Pomfret, RN  Height: 5\' 5"  (1.651 m)  Cup Size: C  BMI:  Body mass index is 50.09 kg/(m^2). Weight:  301 lb (136.533 kg)   Tech Comments:  N/A    Nuclear Med Study 1 or 2 day study: 1 day  Stress Test Type:  Lexiscan  Order Authorizing Provider:  DAVID HARDING,MD   Resting Radionuclide: Technetium 27m Sestamibi  Resting Radionuclide Dose: 11.0 mCi   Stress Radionuclide:  Technetium 25m Sestamibi  Stress Radionuclide Dose: 29.2 mCi           Stress Protocol Rest HR: 61 Stress HR: 93  Rest BP: 139/78 Stress BP: 152/73  Exercise Time (min): n/a METS: n/a          Dose of Adenosine (mg):  n/a Dose of Lexiscan: 0.4 mg  Dose of Atropine (mg): n/a Dose of Dobutamine: n/a mcg/kg/min (at max HR)  Stress Test Technologist: Ernestene Mention, CCT Nuclear Technologist: Gonzella Lex, CNMT   Rest Procedure:  Myocardial perfusion imaging was performed at rest 45 minutes following the intravenous administration of Technetium 38m Sestamibi. Stress Procedure:  The patient received IV Lexiscan 0.4 mg over 15-seconds.  Technetium 48m Sestamibi injected at 30-seconds.  There were no significant changes with Lexiscan.  Quantitative spect images were obtained after a 45 minute delay.  Transient Ischemic Dilatation (Normal <1.22):   0.95 Lung/Heart Ratio (Normal <0.45):  0.33 QGS EDV:  126 ml QGS ESV:  36 ml LV Ejection Fraction: 72%  Signed by       Rest ECG: NSR - Normal EKG  Stress ECG: No significant change from baseline ECG  QPS Raw Data Images:  Normal; no motion artifact; normal heart/lung ratio. Stress Images:  There is decreased uptake in the inferior wall. Rest Images:  There is decreased uptake in the inferior wall. Subtraction (SDS):  No evidence of ischemia.  Impression Exercise Capacity:  Lexiscan with no exercise. BP Response:  Normal blood pressure response. Clinical Symptoms:  There is dyspnea. ECG Impression:  No significant ST segment change suggestive of ischemia. Comparison with Prior Nuclear Study: No images to compare  Overall Impression:  Low risk with diaphragmatic attenuation  LV Wall Motion:  NL LV Function; NL Wall Motion   Runell Gess, MD  01/16/2013 5:16 PM

## 2014-05-01 IMAGING — CR DG CHEST 2V
2 series · 2 of 2 positions shown · non-contrast
Comparison: None.

CLINICAL DATA: Heart palpitations; hypertension

CHEST - 2 VIEW

[w chest pa]
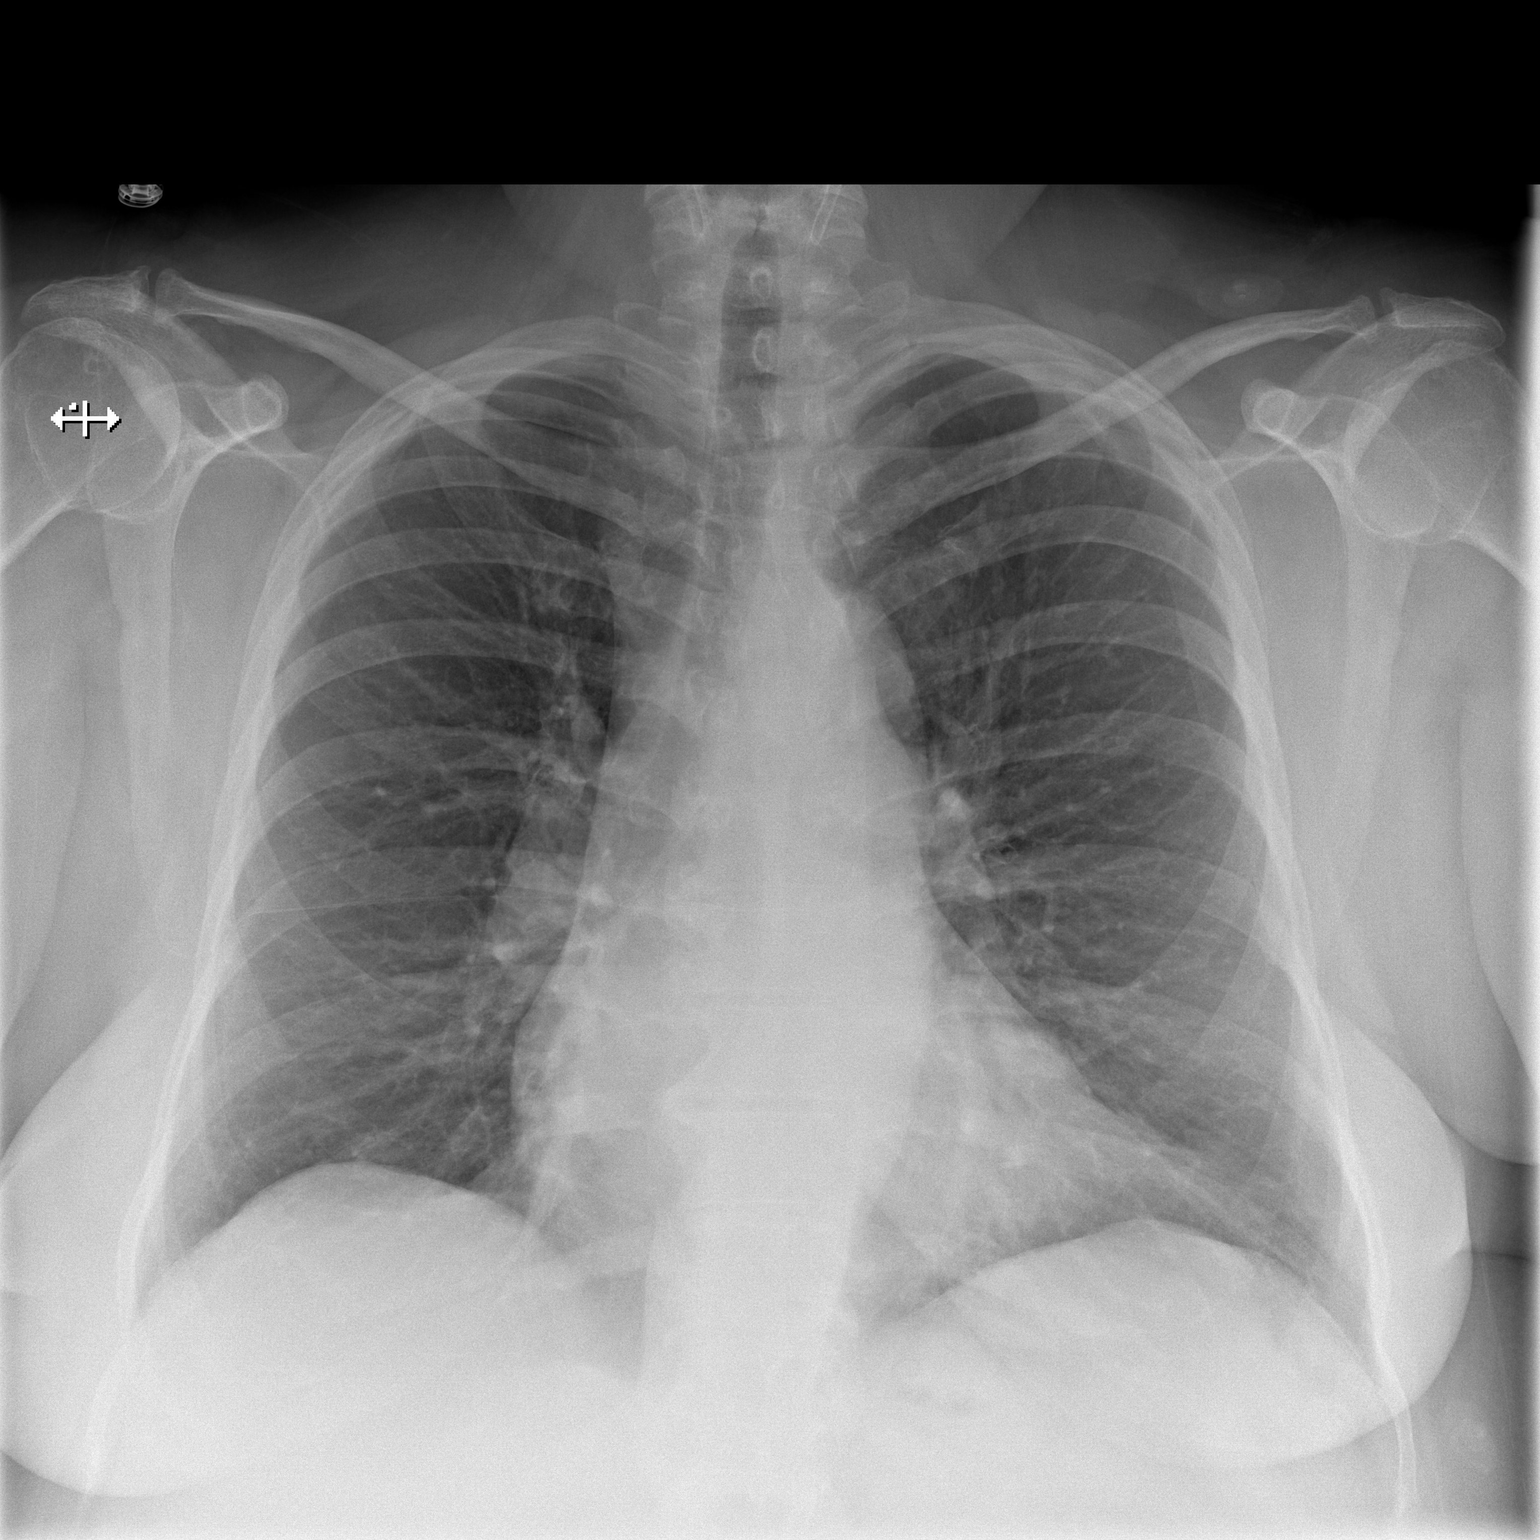

[w chest lat]
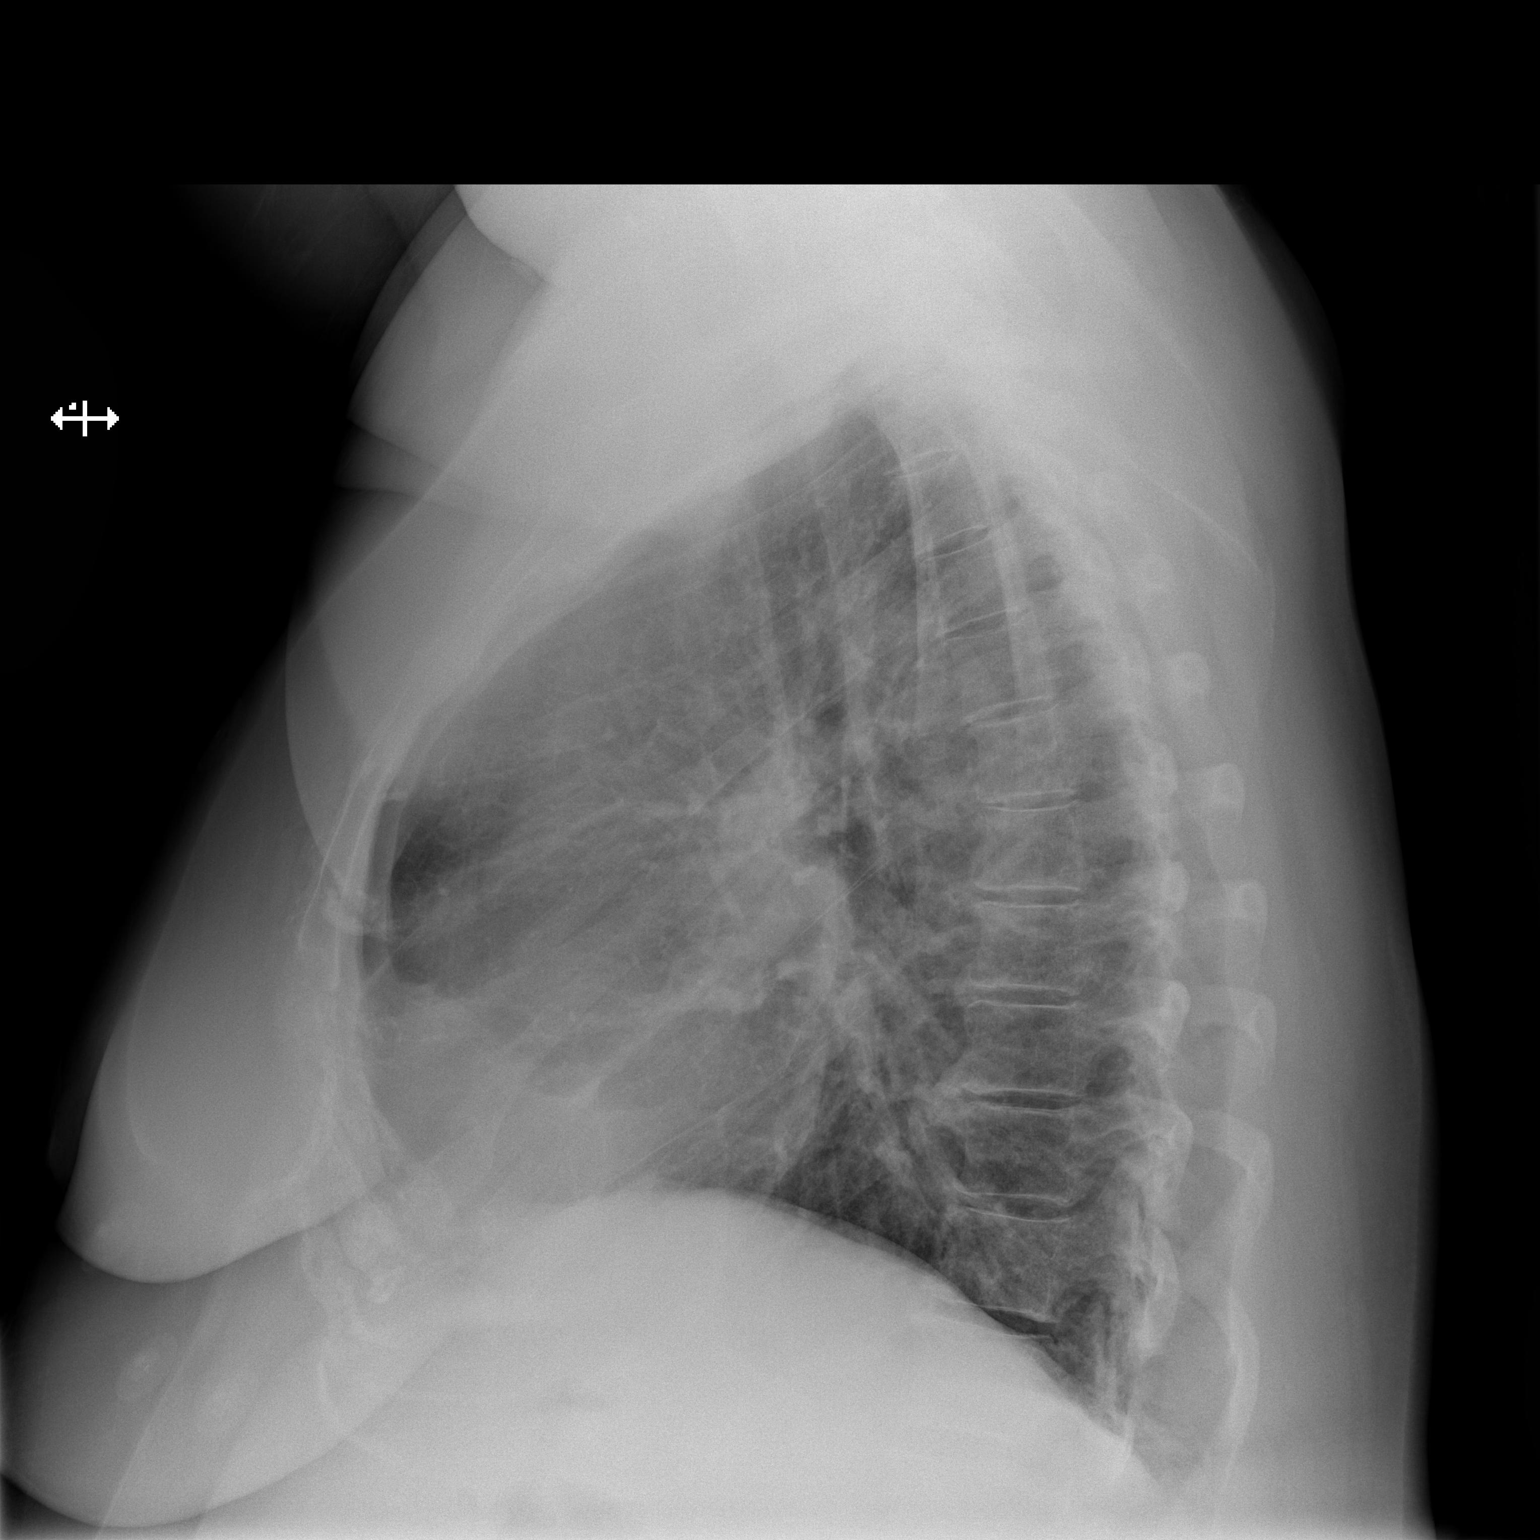

[2 of 2 positions shown; findings below may reference images not displayed]

FINDINGS: Lungs clear.  Heart size and pulmonary vascularity are
normal.  No adenopathy.  There is degenerative change in the
thoracic spine.
IMPRESSION: No edema or consolidation.

## 2015-01-19 ENCOUNTER — Other Ambulatory Visit (HOSPITAL_COMMUNITY): Payer: Self-pay | Admitting: Family Medicine

## 2015-01-19 DIAGNOSIS — R109 Unspecified abdominal pain: Secondary | ICD-10-CM

## 2015-01-21 ENCOUNTER — Emergency Department (HOSPITAL_COMMUNITY): Payer: BLUE CROSS/BLUE SHIELD

## 2015-01-21 ENCOUNTER — Observation Stay (HOSPITAL_COMMUNITY)
Admission: EM | Admit: 2015-01-21 | Discharge: 2015-01-23 | Disposition: A | Discharge status: Home / Self Care | Payer: BLUE CROSS/BLUE SHIELD | Attending: Cardiovascular Disease | Admitting: Cardiovascular Disease

## 2015-01-21 ENCOUNTER — Encounter (HOSPITAL_COMMUNITY): Payer: Self-pay | Admitting: Emergency Medicine

## 2015-01-21 DIAGNOSIS — N079 Hereditary nephropathy, not elsewhere classified with unspecified morphologic lesions: Secondary | ICD-10-CM | POA: Diagnosis not present

## 2015-01-21 DIAGNOSIS — R079 Chest pain, unspecified: Secondary | ICD-10-CM | POA: Diagnosis present

## 2015-01-21 DIAGNOSIS — Z79899 Other long term (current) drug therapy: Secondary | ICD-10-CM | POA: Diagnosis not present

## 2015-01-21 DIAGNOSIS — I10 Essential (primary) hypertension: Secondary | ICD-10-CM | POA: Diagnosis not present

## 2015-01-21 DIAGNOSIS — R0789 Other chest pain: Secondary | ICD-10-CM | POA: Diagnosis present

## 2015-01-21 HISTORY — DX: Unspecified viral hepatitis B without hepatic coma: B19.10

## 2015-01-21 HISTORY — DX: Unspecified osteoarthritis, unspecified site: M19.90

## 2015-01-21 HISTORY — DX: Headache: R51

## 2015-01-21 HISTORY — DX: Gastro-esophageal reflux disease without esophagitis: K21.9

## 2015-01-21 HISTORY — DX: Personal history of other diseases of the musculoskeletal system and connective tissue: Z87.39

## 2015-01-21 HISTORY — DX: Migraine, unspecified, not intractable, without status migrainosus: G43.909

## 2015-01-21 HISTORY — DX: Headache, unspecified: R51.9

## 2015-01-21 HISTORY — DX: Anxiety disorder, unspecified: F41.9

## 2015-01-21 HISTORY — DX: Cardiac murmur, unspecified: R01.1

## 2015-01-21 LAB — CBC WITH DIFFERENTIAL/PLATELET
BASOS PCT: 1 % (ref 0–1)
Basophils Absolute: 0 10*3/uL (ref 0.0–0.1)
EOS PCT: 1 % (ref 0–5)
Eosinophils Absolute: 0.1 10*3/uL (ref 0.0–0.7)
HCT: 47.2 % — ABNORMAL HIGH (ref 36.0–46.0)
HEMOGLOBIN: 16.1 g/dL — AB (ref 12.0–15.0)
LYMPHS ABS: 1.9 10*3/uL (ref 0.7–4.0)
LYMPHS PCT: 23 % (ref 12–46)
MCH: 30.6 pg (ref 26.0–34.0)
MCHC: 34.1 g/dL (ref 30.0–36.0)
MCV: 89.7 fL (ref 78.0–100.0)
MONOS PCT: 9 % (ref 3–12)
Monocytes Absolute: 0.8 10*3/uL (ref 0.1–1.0)
NEUTROS ABS: 5.5 10*3/uL (ref 1.7–7.7)
NEUTROS PCT: 66 % (ref 43–77)
Platelets: 241 10*3/uL (ref 150–400)
RBC: 5.26 MIL/uL — AB (ref 3.87–5.11)
RDW: 12.7 % (ref 11.5–15.5)
WBC: 8.2 10*3/uL (ref 4.0–10.5)

## 2015-01-21 LAB — BASIC METABOLIC PANEL
ANION GAP: 9 (ref 5–15)
BUN: 14 mg/dL (ref 6–23)
CHLORIDE: 96 mmol/L (ref 96–112)
CO2: 29 mmol/L (ref 19–32)
CREATININE: 0.62 mg/dL (ref 0.50–1.10)
Calcium: 9.5 mg/dL (ref 8.4–10.5)
GFR calc non Af Amer: >90 mL/min (ref >90)
Glucose, Bld: 121 mg/dL — ABNORMAL HIGH (ref 70–99)
Potassium: 4.6 mmol/L (ref 3.5–5.1)
Sodium: 134 mmol/L — ABNORMAL LOW (ref 135–145)

## 2015-01-21 LAB — I-STAT TROPONIN, ED
TROPONIN I, POC: 0 ng/mL (ref 0.00–0.08)
Troponin i, poc: 0 ng/mL (ref 0.00–0.08)

## 2015-01-21 LAB — TROPONIN I: Troponin I: <0.03 ng/mL (ref <0.031)

## 2015-01-21 MED ORDER — MAGNESIUM 250 MG PO TABS: 250.0000 mg | ORAL_TABLET | Freq: Two times a day (BID) | ORAL | Status: DC

## 2015-01-21 MED ORDER — COQ-10 200 MG PO CAPS: 1.0000 | ORAL_CAPSULE | Freq: Every day | ORAL | Status: DC

## 2015-01-21 MED ORDER — HYDROCHLOROTHIAZIDE 25 MG PO TABS
25.0000 mg | ORAL_TABLET | Freq: Every day | ORAL | Status: DC
  Filled 2015-01-21 (×2): qty 1

## 2015-01-21 MED ORDER — LORAZEPAM 0.5 MG PO TABS: 0.5000 mg | ORAL_TABLET | Freq: Three times a day (TID) | ORAL | Status: DC | PRN

## 2015-01-21 MED ORDER — MAGNESIUM OXIDE 400 (241.3 MG) MG PO TABS
400.0000 mg | ORAL_TABLET | Freq: Every day | ORAL | Status: DC
  Administered 2015-01-22 – 2015-01-23 (×2): 400 mg via ORAL
  Filled 2015-01-21 (×2): qty 1

## 2015-01-21 MED ORDER — OMEGA-3-ACID ETHYL ESTERS 1 G PO CAPS
6.0000 g | ORAL_CAPSULE | Freq: Two times a day (BID) | ORAL | Status: DC
  Administered 2015-01-21 – 2015-01-23 (×4): 6 g via ORAL
  Filled 2015-01-21 (×4): qty 6

## 2015-01-21 MED ORDER — MAGNESIUM OXIDE 400 (241.3 MG) MG PO TABS: 200.0000 mg | ORAL_TABLET | Freq: Two times a day (BID) | ORAL | Status: DC

## 2015-01-21 MED ORDER — NIFEDIPINE ER 60 MG PO TB24
60.0000 mg | ORAL_TABLET | Freq: Once | ORAL | Status: DC
  Filled 2015-01-21: qty 1

## 2015-01-21 MED ORDER — ASPIRIN 81 MG PO CHEW
324.0000 mg | CHEWABLE_TABLET | Freq: Once | ORAL | Status: DC
  Filled 2015-01-21: qty 4

## 2015-01-21 MED ORDER — NEBIVOLOL HCL 5 MG PO TABS
5.0000 mg | ORAL_TABLET | Freq: Every day | ORAL | Status: DC
  Administered 2015-01-21: 5 mg via ORAL
  Administered 2015-01-22: 2.5 mg via ORAL
  Filled 2015-01-21 (×4): qty 1

## 2015-01-21 MED ORDER — ACETAMINOPHEN 500 MG PO TABS
500.0000 mg | ORAL_TABLET | Freq: Four times a day (QID) | ORAL | Status: DC | PRN
  Administered 2015-01-21: 500 mg via ORAL
  Filled 2015-01-21: qty 1

## 2015-01-21 NOTE — ED Notes (Addendum)
Patient transported to X-ray without distress.  

## 2015-01-21 NOTE — ED Notes (Signed)
Pt returned from xray without distress; back on monitor.

## 2015-01-21 NOTE — ED Notes (Signed)
Pt reports she had flank pain early last week and was seen by PCP and was told that she may have had an MI.  After lab work the pt reports that her PCP said she did not have an MI.  Pt reports that since then she has felt intermittent spikes in her blood pressure with intermittent chest pain.  Pt reports she was sitting at her computer today when she felt a sharp pain in her chest that lasted a "couple seconds"  And she could then feel herself getting flushed and her blood pressure rising.

## 2015-01-21 NOTE — ED Notes (Signed)
Pt c/o mid sternal CP and htn x several days intermittently; pt sts had irregular HR at PCP last week

## 2015-01-21 NOTE — Consult Note (Signed)
Reason for Consult: Atypical CP  Requesting Physician: Helane Gunther  HPI: [name] is a 64 year old mildly overweight married Caucasian female with no children who is accompanied by her husband today in the emergency room. She lives in [place]. Her primary care doctor is Dr. Elvina Mattes. Her cardiac risk factor profile is only notable for treated hypertension.is remote history of IgA nephropathy. She does not smoke. There is no family history other than a half brother who has had a stent. She has never had a heart attack or stroke. Years ago she had an episode of chest pain was seen here at [facility] where a 2-D echo and Myoview stress test were normal. She has had no recurrent symptoms until recently. She has complain of some low back pain thought to be related to kidney infection which she has been treated for vs nephrolithiasis. She has been having intermittent sharp pain over the last several months as well. She developed chest pain today along with shopping but is currently asymptomatic. Her EKG shows no acute changes other than a PVC. Her enzymes are negative. Her exam is benign.  Problem List: Patient Active Problem List   Diagnosis Date Noted  . Chest pain 01/04/2013  . Hypertension 01/04/2013  . Hypertensive urgency 01/04/2013  . History of rheumatic fever 01/04/2013    PMHx:  Past Medical History  Diagnosis Date  . IgAN (IgA nephropathy)   . Hypertension   . Rheumatic fever    Past Surgical History  Procedure Laterality Date  . Cholecystectomy    . Ercp      FAMHx: Family History  Problem Relation Age of Onset  . Coronary artery disease Other     Half brother    SOCHx:  reports that she has never smoked. She has never used smokeless tobacco. She reports that she does not drink alcohol. Her drug history is not on file.  ALLERGIES: Allergies  Allergen Reactions  . Clonidine Derivatives Other (See Comments)    SEVERE negative  reaction, waves of heat over body, very elevated BP  . Flagyl [Metronidazole] Nausea And Vomiting  . Hydralazine Other (See Comments)    SEVERE negative reaction  . Statins Other (See Comments)    Muscle weakness in arms  . Sulfa Antibiotics Rash    Turns red    ROS: Pertinent items are noted in HPI.  HOME MEDICATIONS:  (Not in a hospital admission)  HOSPITAL MEDICATIONS: I have reviewed the patient's current medications.  VITALS: Blood pressure 160/73, pulse 62, temperature 98 F (36.7 C), temperature source Oral, resp. rate 15, SpO2 98 %.  INPUT/OUTPUT        PHYSICAL EXAM: General appearance: alert and no distress Neck: no adenopathy, no carotid bruit, no JVD, supple, symmetrical, trachea midline and thyroid not enlarged, symmetric, no tenderness/mass/nodules Lungs: clear to auscultation bilaterally Heart: regular rate and rhythm, S1, S2 normal, no murmur, click, rub or gallop Extremities: extremities normal, atraumatic, no cyanosis or edema  LABS:  BMP  Recent Labs  01/21/15 1309  NA 134*  K 4.6  CL 96  CO2 29  GLUCOSE 121*  BUN 14  CREATININE 0.62  CALCIUM 9.5  GFRNONAA >90  GFRAA >90    CBC  Recent Labs Lab 01/21/15 1309  WBC 8.2  RBC 5.26*  HGB 16.1*  HCT 47.2*  PLT 241  MCV 89.7    HEMOGLOBIN A1C Lab Results  Component Value Date   HGBA1C 5.8* 01/04/2013   MPG 120*  01/04/2013    Cardiac Panel (last 3 results) No results for input(s): CKTOTAL, CKMB, TROPONINI, RELINDX in the last 8760 hours.  BNP (last 3 results) No results for input(s): PROBNP in the last 8760 hours.  TSH No results for input(s): TSH in the last 8760 hours.  CHOLESTEROL No results for input(s): CHOL in the last 8760 hours.  Hepatic Function Panel No results for input(s): PROT, ALBUMIN, AST, ALT, ALKPHOS, BILITOT, BILIDIR, IBILI in the last 8760 hours.  IMAGING: Dg Chest 2 View  01/21/2015   CLINICAL DATA:  Left-sided chest pain, no known injury,  initial encounter  EXAM: CHEST  2 VIEW  COMPARISON:  01/04/2013  FINDINGS: Cardiac shadow is stable. The lungs are well aerated bilaterally. No acute bony abnormality is noted.  IMPRESSION: No active cardiopulmonary disease.   Electronically Signed   By: Inez Catalina M.D.   On: 01/21/2015 15:07   EKG- NSR 71 w/o STTWC, occasional PVCs. I personally reviewed this EKG  IMPRESSION: 1. Chest pain: Her chest pain sounds atypical. She did have a negative stress test 2 years ago.Exam is negative as are her cardiac enzymes and EKG. I recommend having her come in overnight, cycling enzymes and obtain an exercise Myoview stress test in the morning. If this is negative she can be discharged home with outpatient follow-up of her chest and back pain.   RECOMMENDATION: 1. Exercise Myoview stress test, cycle cardiac enzymes  Time Spent Directly with Patient: 30 minutes  Dylin Breeden J 01/21/2015, 4:17 PM

## 2015-01-21 NOTE — ED Notes (Signed)
Rameen Gohlke (pt's husband) [phone]

## 2015-01-21 NOTE — ED Notes (Signed)
Attempted report x 2. Left number with Network engineer.

## 2015-01-21 NOTE — ED Provider Notes (Addendum)
CSN: [id number]     Arrival date & time 01/21/15  1238 History   First MD Initiated Contact with Patient 01/21/15 1340     Chief Complaint  Patient presents with  . Chest Pain     (Consider location/radiation/quality/duration/timing/severity/associated sxs/prior Treatment) Patient is a 64 y.o. female presenting with chest pain. The history is provided by the patient.  Chest Pain Associated symptoms: no abdominal pain, no back pain, no fever, no headache, no nausea, no shortness of breath and not vomiting    Patient seen by Dr. Alvester Chou from cardiology in the March 2014. Had stress test done without sniffing findings. Here for the past few weeks patient's been having intermittent sharp pain in the chest lasting seconds associated with a ache or pressure sensations lasted a little bit longer. Here for that today this is happening more frequently. Never had any constant pain for 20 or 30 minutes or longer. Patient's blood pressure tends to go up when this occurs. Denies any shortness of breath nausea or vomiting. No syncope. No belly pain. Patient's primary care doctor is in [place].  Past Medical History  Diagnosis Date  . IgAN (IgA nephropathy)   . Hypertension   . Rheumatic fever    Past Surgical History  Procedure Laterality Date  . Cholecystectomy    . Ercp     Family History  Problem Relation Age of Onset  . Coronary artery disease Other     Half brother   History  Substance Use Topics  . Smoking status: Never Smoker   . Smokeless tobacco: Never Used  . Alcohol Use: No   OB History    No data available     Review of Systems  Constitutional: Negative for fever.  HENT: Negative for congestion.   Respiratory: Negative for shortness of breath.   Cardiovascular: Positive for chest pain.  Gastrointestinal: Negative for nausea, vomiting and abdominal pain.  Genitourinary: Negative for dysuria.  Musculoskeletal: Negative for back pain.  Skin: Negative for rash.   Neurological: Negative for headaches.  Hematological: Does not bruise/bleed easily.  Psychiatric/Behavioral: Negative for confusion.      Allergies  Flagyl; Statins; and Sulfa antibiotics  Home Medications   Prior to Admission medications   Medication Sig Start Date End Date Taking? Authorizing Provider  b complex vitamins tablet Take 1 tablet by mouth daily.    Historical Provider, MD  benazepril (LOTENSIN) 40 MG tablet Take 40 mg by mouth daily.    Historical Provider, MD  cloNIDine (CATAPRES) 0.1 MG tablet Take 1 tablet (0.1 mg total) by mouth daily. 01/04/13   Nita Sells, MD  Coenzyme Q10 (COQ-10) 200 MG CAPS Take 1 capsule by mouth daily.    Historical Provider, MD  colchicine 0.6 MG tablet Take 1 tablet (0.6 mg total) by mouth 2 (two) times daily. 01/05/13   Nita Sells, MD  CRANBERRY PO Take 1 tablet by mouth daily.    Historical Provider, MD  hydrALAZINE (APRESOLINE) 25 MG tablet Take 25 mg by mouth 2 (two) times daily.    Historical Provider, MD  losartan (COZAAR) 100 MG tablet Take 1 tablet (100 mg total) by mouth daily. 01/05/13   Nita Sells, MD  Magnesium 250 MG TABS Take 250 mg by mouth 2 (two) times daily.    Historical Provider, MD  nebivolol (BYSTOLIC) 5 MG tablet Take 5 mg by mouth daily.    Historical Provider, MD  NIFEdipine (PROCARDIA-XL/ADALAT-CC/NIFEDICAL-XL) 30 MG 24 hr tablet Take 60 mg by mouth once.  Historical Provider, MD  omega-3 acid ethyl esters (LOVAZA) 1 G capsule Take 6 g by mouth 2 (two) times daily.    Historical Provider, MD  OVER THE COUNTER MEDICATION Take 450 mg by mouth daily. Tumeric    Historical Provider, MD  Vitamin D, Ergocalciferol, (DRISDOL) 50000 UNITS CAPS Take 50,000 Units by mouth every 7 (seven) days. Takes on Wednesday    Historical Provider, MD  vitamin E 400 UNIT capsule Take 400 Units by mouth daily.    Historical Provider, MD   BP 160/73 mmHg  Pulse 62  Temp(Src) 98 F (36.7 C) (Oral)  Resp 15   SpO2 98% Physical Exam  Constitutional: She is oriented to person, place, and time. She appears well-developed and well-nourished. No distress.  HENT:  Head: Normocephalic and atraumatic.  Mouth/Throat: Oropharynx is clear and moist.  Eyes: EOM are normal. Pupils are equal, round, and reactive to light.  Neck: Normal range of motion. Neck supple.  Cardiovascular: Normal rate, regular rhythm and normal heart sounds.   Pulmonary/Chest: Effort normal and breath sounds normal. No respiratory distress.  Abdominal: Soft. Bowel sounds are normal. She exhibits no distension.  Musculoskeletal: Normal range of motion. She exhibits no edema.  Neurological: She is alert and oriented to person, place, and time. No cranial nerve deficit. She exhibits normal muscle tone. Coordination normal.  Skin: Skin is warm. No rash noted.  Nursing note and vitals reviewed.   ED Course  Procedures (including critical care time) Labs Review Labs Reviewed  BASIC METABOLIC PANEL - Abnormal; Notable for the following:    Sodium 134 (*)    Glucose, Bld 121 (*)    All other components within normal limits  CBC WITH DIFFERENTIAL/PLATELET - Abnormal; Notable for the following:    RBC 5.26 (*)    Hemoglobin 16.1 (*)    HCT 47.2 (*)    All other components within normal limits  I-STAT TROPOININ, ED   Results for orders placed or performed during the hospital encounter of 96/28/36  Basic metabolic panel  Result Value Ref Range   Sodium 134 (L) 135 - 145 mmol/L   Potassium 4.6 3.5 - 5.1 mmol/L   Chloride 96 96 - 112 mmol/L   CO2 29 19 - 32 mmol/L   Glucose, Bld 121 (H) 70 - 99 mg/dL   BUN 14 6 - 23 mg/dL   Creatinine, Ser 0.62 0.50 - 1.10 mg/dL   Calcium 9.5 8.4 - 10.5 mg/dL   GFR calc non Af Amer >90 >90 mL/min   GFR calc Af Amer >90 >90 mL/min   Anion gap 9 5 - 15  CBC with Differential  Result Value Ref Range   WBC 8.2 4.0 - 10.5 K/uL   RBC 5.26 (H) 3.87 - 5.11 MIL/uL   Hemoglobin 16.1 (H) 12.0 - 15.0  g/dL   HCT 47.2 (H) 36.0 - 46.0 %   MCV 89.7 78.0 - 100.0 fL   MCH 30.6 26.0 - 34.0 pg   MCHC 34.1 30.0 - 36.0 g/dL   RDW 12.7 11.5 - 15.5 %   Platelets 241 150 - 400 K/uL   Neutrophils Relative % 66 43 - 77 %   Neutro Abs 5.5 1.7 - 7.7 K/uL   Lymphocytes Relative 23 12 - 46 %   Lymphs Abs 1.9 0.7 - 4.0 K/uL   Monocytes Relative 9 3 - 12 %   Monocytes Absolute 0.8 0.1 - 1.0 K/uL   Eosinophils Relative 1 0 - 5 %  Eosinophils Absolute 0.1 0.0 - 0.7 K/uL   Basophils Relative 1 0 - 1 %   Basophils Absolute 0.0 0.0 - 0.1 K/uL  I-stat troponin, ED (not at [facility])  Result Value Ref Range   Troponin i, poc 0.00 0.00 - 0.08 ng/mL   Comment 3             Imaging Review Dg Chest 2 View  01/21/2015   CLINICAL DATA:  Left-sided chest pain, no known injury, initial encounter  EXAM: CHEST  2 VIEW  COMPARISON:  01/04/2013  FINDINGS: Cardiac shadow is stable. The lungs are well aerated bilaterally. No acute bony abnormality is noted.  IMPRESSION: No active cardiopulmonary disease.   Electronically Signed   By: Inez Catalina M.D.   On: 01/21/2015 15:07     EKG Interpretation   Date/Time:  Wednesday January 21 2015 15:02:04 EST Ventricular Rate:  73 PR Interval:  240 QRS Duration: 101 QT Interval:  429 QTC Calculation: 473 R Axis:   -71 Text Interpretation:  Sinus rhythm Ventricular bigeminy Prolonged PR  interval Left anterior fascicular block Abnormal R-wave progression, late  transition Left ventricular hypertrophy No significant change since last  tracing Confirmed by Ehtan Delfavero  MD, Amiylah Anastos ([id number]) on 01/21/2015 3:10:42 PM      MDM   Final diagnoses:  Chest pain, unspecified chest pain type    Patient with worsening chest pressure and intermittent sharp pain. Associated with spikes in the blood pressure. Symptoms much worse today. Had these on and off for the past few weeks. Patient had H nonexercise stress test done in 2014 without significant findings. Patient has not had a cardiac cath.  Patient seen by Donnella Bi in the past. Patient's primary care doctor is in [place].  EKG without acute MI changes. There is a left hand tear fascicular block with abnormal R-wave progression. But not any significant changes compared to past tracings. Patient did have aspirin at home so was not given aspirin here. Patient's chest x-rays negative. First troponin is negative. Patient's had another bout of the chest pressure since the first troponin was drawn. Patient now is currently chest pain-free. Repeat EKG showed no changes.    Fredia Sorrow, MD 01/21/15 1603  Addendum: Patient's second troponin is negative. Patient will be seen by cardiology. With a second troponin being negative patient may very will be discharged home for outpatient workup.  Results for orders placed or performed during the hospital encounter of 27/03/50  Basic metabolic panel  Result Value Ref Range   Sodium 134 (L) 135 - 145 mmol/L   Potassium 4.6 3.5 - 5.1 mmol/L   Chloride 96 96 - 112 mmol/L   CO2 29 19 - 32 mmol/L   Glucose, Bld 121 (H) 70 - 99 mg/dL   BUN 14 6 - 23 mg/dL   Creatinine, Ser 0.62 0.50 - 1.10 mg/dL   Calcium 9.5 8.4 - 10.5 mg/dL   GFR calc non Af Amer >90 >90 mL/min   GFR calc Af Amer >90 >90 mL/min   Anion gap 9 5 - 15  CBC with Differential  Result Value Ref Range   WBC 8.2 4.0 - 10.5 K/uL   RBC 5.26 (H) 3.87 - 5.11 MIL/uL   Hemoglobin 16.1 (H) 12.0 - 15.0 g/dL   HCT 47.2 (H) 36.0 - 46.0 %   MCV 89.7 78.0 - 100.0 fL   MCH 30.6 26.0 - 34.0 pg   MCHC 34.1 30.0 - 36.0 g/dL   RDW 12.7 11.5 -  15.5 %   Platelets 241 150 - 400 K/uL   Neutrophils Relative % 66 43 - 77 %   Neutro Abs 5.5 1.7 - 7.7 K/uL   Lymphocytes Relative 23 12 - 46 %   Lymphs Abs 1.9 0.7 - 4.0 K/uL   Monocytes Relative 9 3 - 12 %   Monocytes Absolute 0.8 0.1 - 1.0 K/uL   Eosinophils Relative 1 0 - 5 %   Eosinophils Absolute 0.1 0.0 - 0.7 K/uL   Basophils Relative 1 0 - 1 %   Basophils Absolute 0.0 0.0 -  0.1 K/uL  I-stat troponin, ED (not at [facility])  Result Value Ref Range   Troponin i, poc 0.00 0.00 - 0.08 ng/mL   Comment 3          I-Stat Troponin, ED (not at [facility])  Result Value Ref Range   Troponin i, poc 0.00 0.00 - 0.08 ng/mL   Comment 3             Fredia Sorrow, MD 01/21/15 1621

## 2015-01-21 NOTE — ED Notes (Signed)
Attempted report x1. 

## 2015-01-22 ENCOUNTER — Inpatient Hospital Stay (HOSPITAL_COMMUNITY): Payer: BLUE CROSS/BLUE SHIELD

## 2015-01-22 DIAGNOSIS — I10 Essential (primary) hypertension: Secondary | ICD-10-CM | POA: Diagnosis not present

## 2015-01-22 DIAGNOSIS — R079 Chest pain, unspecified: Secondary | ICD-10-CM | POA: Diagnosis not present

## 2015-01-22 DIAGNOSIS — Z79899 Other long term (current) drug therapy: Secondary | ICD-10-CM | POA: Diagnosis not present

## 2015-01-22 DIAGNOSIS — R0789 Other chest pain: Secondary | ICD-10-CM

## 2015-01-22 DIAGNOSIS — N079 Hereditary nephropathy, not elsewhere classified with unspecified morphologic lesions: Secondary | ICD-10-CM | POA: Diagnosis not present

## 2015-01-22 LAB — TROPONIN I

## 2015-01-22 MED ORDER — HEPARIN SODIUM (PORCINE) 5000 UNIT/ML IJ SOLN
5000.0000 [IU] | Freq: Three times a day (TID) | INTRAMUSCULAR | Status: DC
  Administered 2015-01-22 – 2015-01-23 (×3): 5000 [IU] via SUBCUTANEOUS
  Filled 2015-01-22 (×3): qty 1

## 2015-01-22 NOTE — Progress Notes (Signed)
Exercise stress myoview completed achieving 85% pred max.  This is a 2 day study with stress today.  nuc results to follow.  +PVCs big. Until HR >135 then ST alone.  PVCs returned when HR slowed.     Candee Furbish, MD

## 2015-01-22 NOTE — Plan of Care (Signed)
Problem: Phase II Progression Outcomes Goal: Stress Test if indicated Outcome: Progressing Day 1/2 Stress Test completed

## 2015-01-22 NOTE — Progress Notes (Signed)
  Patient Profile: 64 y/o female with h/o obesity, HTN and remote history of IgA nephropathy admitted with atypical chest pain. EKGs unremarkable. Cardiac enzymes negative x 3.   Subjective: No complaints. Currently CP free.  Objective: Vital signs in last 24 hours: Temp:  [98 F (36.7 C)-98.7 F (37.1 C)] 98.1 F (36.7 C) (03/10 0500) Pulse Rate:  [59-75] 65 (03/10 0500) Resp:  [11-21] 18 (03/09 2039) BP: (119-167)/(49-86) 125/62 mmHg (03/10 0500) SpO2:  [95 %-98 %] 96 % (03/10 0500) Weight:  [280 lb 6.4 oz (127.189 kg)] 280 lb 6.4 oz (127.189 kg) (03/10 0500) Last BM Date: 01/20/15  Intake/Output from previous day: 03/09 0701 - 03/10 0700 In: 840 [P.O.:840] Out: 1250 [Urine:1250] Intake/Output this shift:    Medications Current Facility-Administered Medications  Medication Dose Route Frequency Provider Last Rate Last Dose  . acetaminophen (TYLENOL) tablet 500 mg  500 mg Oral Q6H PRN Jules Husbands, MD   500 mg at 01/21/15 2119  . aspirin chewable tablet 324 mg  324 mg Oral Once Fredia Sorrow, MD   324 mg at 01/21/15 1441  . hydrochlorothiazide (HYDRODIURIL) tablet 25 mg  25 mg Oral Daily Jules Husbands, MD   25 mg at 01/21/15 2312  . LORazepam (ATIVAN) tablet 0.5 mg  0.5 mg Oral Q8H PRN Jules Husbands, MD      . magnesium oxide (MAG-OX) tablet 400 mg  400 mg Oral Daily Lorretta Harp, MD      . nebivolol (BYSTOLIC) tablet 5 mg  5 mg Oral Daily Jules Husbands, MD   5 mg at 01/21/15 2326  . NIFEdipine (PROCARDIA-XL/ADALAT CC) 24 hr tablet 60 mg  60 mg Oral Once Jules Husbands, MD   60 mg at 01/21/15 2327  . omega-3 acid ethyl esters (LOVAZA) capsule 6 g  6 g Oral BID Jules Husbands, MD   6 g at 01/21/15 2326    PE: General appearance: alert, cooperative and no distress Neck: no carotid bruit and no JVD Lungs: clear to auscultation bilaterally Heart: regular rate and rhythm, S1, S2 normal, no murmur, click, rub or gallop Extremities: no LEE Pulses: 2+ and symmetric Skin: warm and  dry Neurologic: Grossly normal  Lab Results:   Recent Labs  01/21/15 1309  WBC 8.2  HGB 16.1*  HCT 47.2*  PLT 241   BMET  Recent Labs  01/21/15 1309  NA 134*  K 4.6  CL 96  CO2 29  GLUCOSE 121*  BUN 14  CREATININE 0.62  CALCIUM 9.5   Cardiac Panel (last 3 results)  Recent Labs  01/21/15 1644 01/21/15 2146 01/22/15 0413  TROPONINI <0.03 <0.03 <0.03    Studies/Results: NST- results pending  Assessment/Plan    Active Problems:   Chest pain   1. Atypical Chest Pain: Currently CP free. Cardiac enzymes negative x 3. EKGs with PVCs but otherwise unremarkable. Plan for exercise Myoview today to r/o ischemia.   2. HTN: Well controlled on HCTZ and Bystolic.   Dispo: possible d/c tomorrow if stress test is normal   LOS: 1 day    Brittainy M. Rosita Fire, PA-C 01/22/2015 8:02 AM  Personally seen and examined. Agree with above. Feels well now Was not eating well past few days RRR CTAB Await NUC, 2 day, first part today  Candee Furbish, MD

## 2015-01-22 NOTE — Progress Notes (Signed)
UR Completed Abdullah Rizzi Graves-Bigelow, RN,BSN [phone]  

## 2015-01-23 ENCOUNTER — Ambulatory Visit (HOSPITAL_COMMUNITY): Payer: BLUE CROSS/BLUE SHIELD

## 2015-01-23 ENCOUNTER — Observation Stay (HOSPITAL_COMMUNITY): Payer: BLUE CROSS/BLUE SHIELD

## 2015-01-23 DIAGNOSIS — N079 Hereditary nephropathy, not elsewhere classified with unspecified morphologic lesions: Secondary | ICD-10-CM

## 2015-01-23 DIAGNOSIS — R079 Chest pain, unspecified: Secondary | ICD-10-CM

## 2015-01-23 MED ORDER — TECHNETIUM TC 99M SESTAMIBI GENERIC - CARDIOLITE: 30.0000 | Freq: Once | INTRAVENOUS | Status: AC | PRN

## 2015-01-23 MED ORDER — TECHNETIUM TC 99M SESTAMIBI GENERIC - CARDIOLITE
30.0000 | Freq: Once | INTRAVENOUS | Status: AC | PRN
  Administered 2015-01-23: 30 via INTRAVENOUS

## 2015-01-23 NOTE — Progress Notes (Signed)
Resting images for nuc are for today.

## 2015-01-23 NOTE — Progress Notes (Signed)
Pt in stable condition and I reviewed d/c instructions with her and husband. No further questions and d/c'd via wheelchair to private vehicle

## 2015-01-23 NOTE — Discharge Summary (Signed)
CARDIOLOGY DISCHARGE SUMMARY   Patient ID: [name] MRN: [id number] DOB/AGE: 63-May-1952 63 y.o.  Admit date: 01/21/2015 Discharge date: 01/23/2015  PCP: Jacqualine Code, DO Primary Cardiologist: Dr. Gwenlyn Found  Primary Discharge Diagnosis:  Precordial chest pain Secondary Discharge Diagnosis:    Atypical chest pain   Morbid obesity  Procedures: Chest x-ray, Exercise Myoview (2 day study)  Hospital Course: [name] is a 64 y.o. female with no history of CAD. She has a history of hypertension, morbid obesity and family history of coronary artery disease in her brother. She had intermittent chest pain and came to the hospital where she was admitted for further evaluation and treatment.  Her cardiac enzymes were negative for MI. A 2 day exercise Myoview was performed, results below. It showed no ischemia and her EF is preserved, with no wall motion abnormalities. It is a low risk study  On 01/23/2015, she was seen by Dr. Marlou Porch and all data were reviewed. No further inpatient workup is indicated and she is considered stable for discharge, to follow up with primary care, and with cardiology when necessary.  Labs:   Lab Results  Component Value Date   WBC 8.2 01/21/2015   HGB 16.1* 01/21/2015   HCT 47.2* 01/21/2015   MCV 89.7 01/21/2015   PLT 241 01/21/2015     Recent Labs Lab 01/21/15 1309  NA 134*  K 4.6  CL 96  CO2 29  BUN 14  CREATININE 0.62  CALCIUM 9.5  GLUCOSE 121*    Recent Labs  01/21/15 1644 01/21/15 2146 01/22/15 0413  TROPONINI <0.03 <0.03 <0.03     Radiology: Dg Chest 2 View 01/21/2015   CLINICAL DATA:  Left-sided chest pain, no known injury, initial encounter  EXAM: CHEST  2 VIEW  COMPARISON:  01/04/2013  FINDINGS: Cardiac shadow is stable. The lungs are well aerated bilaterally. No acute bony abnormality is noted.  IMPRESSION: No active cardiopulmonary disease.   Electronically Signed   By: Inez Catalina M.D.   On: 01/21/2015 15:07    Nm Myocar Multi W/spect W/wall Motion / Ef 01/23/2015   CLINICAL DATA:  Patient is a 64 yo Test to evaluate CP  EXAM: MYOCARDIAL IMAGING WITH SPECT (REST AND EXERCISE - 2 DAY PROTOCOL)  GATED LEFT VENTRICULAR WALL MOTION STUDY  LEFT VENTRICULAR EJECTION FRACTION  TECHNIQUE: Standard myocardial SPECT imaging was performed after resting intravenous injection of 10 mCi Tc-27m sestamibi. On a different day, exercise tolerance test was performed by the patient under the supervision of the Cardiology staff. At peak-stress, 30 mCi Tc-51m sestamibi was injected intravenously and standard myocardial SPECT imaging was performed. Quantitative gated imaging was also performed to evaluate left ventricular wall motion, and estimate left ventricular ejection fraction.  COMPARISON:  None.  FINDINGS: Stress Data: The patient exercised in the Bruce protocol Baseline EKG SR 70 bpm Frequent PVCs LAFB. The patient went 5 min 13 sec to a peak HR of 153 bpm (96% maximal). Peak BP 192/69.  With exertion, PVC frequency declined There were no ST changes to suggest ischemia. The patient experienced no CP  Nuclear data: In the initial stress images there was a moderate area of thinning in the inferior(base,mid, minimally distal) and inferolateral (base, mid, distal) wall. Otherwise normal perfusion. In the recovery images there was no significant change On gating, the LVEF was calculated at 62% with normal wall motion. On review of the raw data, diaphragm underlies the inferior surface of the heart  IMPRESSION: Stress myoview: Clinically  and electrically negative for ischemia. Myoview scan with probable normal perfusion and soft tissue attenuation (diaphragm), cannot exclude subendocardial scar. No ischemia. LVEF on gating 62% with normal wall motion. Overall low risk scan.   Electronically Signed   By: Dorris Carnes M.D.   On: 01/23/2015 11:05   EKG: 01/22/2015 Sinus rhythm, first-degree AV block, frequent PVCs No acute ischemic  changes  FOLLOW UP PLANS AND APPOINTMENTS Allergies  Allergen Reactions  . Clonidine Derivatives Other (See Comments)    SEVERE negative reaction, waves of heat over body, very elevated BP  . Flagyl [Metronidazole] Nausea And Vomiting  . Hydralazine Other (See Comments)    SEVERE negative reaction  . Statins Other (See Comments)    Muscle weakness in arms  . Sulfa Antibiotics Rash    Turns red     Medication List    STOP taking these medications        cloNIDine 0.1 MG tablet  Commonly known as:  CATAPRES     colchicine 0.6 MG tablet      TAKE these medications        acetaminophen 500 MG tablet  Commonly known as:  TYLENOL  Take 500 mg by mouth every 6 (six) hours as needed for mild pain.     b complex vitamins tablet  Take 1 tablet by mouth daily.     benazepril 40 MG tablet  Commonly known as:  LOTENSIN  Take 40 mg by mouth daily.     CoQ-10 200 MG Caps  Take 1 capsule by mouth daily.     CRANBERRY PO  Take 1 tablet by mouth daily.     hydrochlorothiazide 25 MG tablet  Commonly known as:  HYDRODIURIL  Take 25 mg by mouth daily.     LORazepam 0.5 MG tablet  Commonly known as:  ATIVAN  Take 0.5 mg by mouth every 8 (eight) hours as needed for anxiety.     losartan 100 MG tablet  Commonly known as:  COZAAR  Take 1 tablet (100 mg total) by mouth daily.     Magnesium 250 MG Tabs  Take 250 mg by mouth 2 (two) times daily.     nebivolol 5 MG tablet  Commonly known as:  BYSTOLIC  Take 5 mg by mouth daily.     NIFEdipine 30 MG 24 hr tablet  Commonly known as:  PROCARDIA-XL/ADALAT-CC/NIFEDICAL-XL  Take 60 mg by mouth once.     omega-3 acid ethyl esters 1 G capsule  Commonly known as:  LOVAZA  Take 6 g by mouth 2 (two) times daily.     OVER THE COUNTER MEDICATION  Take 450 mg by mouth daily. Tumeric     Vitamin D (Ergocalciferol) 50000 UNITS Caps capsule  Commonly known as:  DRISDOL  Take 50,000 Units by mouth every 7 (seven) days. Takes on  Wednesday     vitamin E 400 UNIT capsule  Take 400 Units by mouth daily.        Discharge Instructions    Diet - low sodium heart healthy    Complete by:  As directed      Increase activity slowly    Complete by:  As directed           Follow-up Information    Follow up with FAVERO,JOHN PATRICK, DO.   Specialty:  Family Medicine   Contact information:   [address] [place] [address] [phone]       Follow up with  Lorretta Harp, MD.   Specialty:  Cardiology   Why:  As needed   Contact information:   [address] [place] [address] [phone]       BRING ALL MEDICATIONS WITH YOU TO FOLLOW UP APPOINTMENTS  Time spent with patient to include physician time: 35 min Signed: Rosaria Ferries, PA-C 01/23/2015, 12:19 PM Co-Sign MD

## 2015-01-23 NOTE — Progress Notes (Signed)
  Patient Profile: 64 y/o female with h/o obesity, HTN and remote history of IgA nephropathy admitted with atypical chest pain. EKGs unremarkable. Cardiac enzymes negative x 3.   Subjective: No complaints. Currently CP free. She and husband are happy about news.   Objective: Vital signs in last 24 hours: Temp:  [98.3 F (36.8 C)-98.6 F (37 C)] 98.3 F (36.8 C) (03/11 0515) Pulse Rate:  [62-67] 62 (03/11 0515) Resp:  [18-19] 18 (03/11 0515) BP: (109-129)/(43-83) 126/43 mmHg (03/11 1027) SpO2:  [96 %-98 %] 96 % (03/11 0515) Last BM Date: 01/22/15  Intake/Output from previous day: 03/10 0701 - 03/11 0700 In: 480 [P.O.:480] Out: 2950 [Urine:2950] Intake/Output this shift:    Medications Current Facility-Administered Medications  Medication Dose Route Frequency Provider Last Rate Last Dose  . acetaminophen (TYLENOL) tablet 500 mg  500 mg Oral Q6H PRN Jules Husbands, MD   500 mg at 01/21/15 2119  . aspirin chewable tablet 324 mg  324 mg Oral Once Fredia Sorrow, MD   324 mg at 01/21/15 1441  . heparin injection 5,000 Units  5,000 Units Subcutaneous 3 times per day Lorretta Harp, MD   5,000 Units at 01/23/15 0606  . hydrochlorothiazide (HYDRODIURIL) tablet 25 mg  25 mg Oral Daily Jules Husbands, MD   25 mg at 01/21/15 2312  . LORazepam (ATIVAN) tablet 0.5 mg  0.5 mg Oral Q8H PRN Jules Husbands, MD      . magnesium oxide (MAG-OX) tablet 400 mg  400 mg Oral Daily Lorretta Harp, MD   400 mg at 01/23/15 1026  . nebivolol (BYSTOLIC) tablet 5 mg  5 mg Oral Daily Jules Husbands, MD   2.5 mg at 01/22/15 2118  . NIFEdipine (PROCARDIA-XL/ADALAT CC) 24 hr tablet 60 mg  60 mg Oral Once Jules Husbands, MD   60 mg at 01/21/15 2327  . omega-3 acid ethyl esters (LOVAZA) capsule 6 g  6 g Oral BID Jules Husbands, MD   6 g at 01/23/15 1025    PE: General appearance: alert, cooperative and no distress Neck: no carotid bruit and no JVD Lungs: clear to auscultation bilaterally Heart: regular rate and rhythm, S1,  S2 normal, no murmur, click, rub or gallop Extremities: no LEE Pulses: 2+ and symmetric Skin: warm and dry Neurologic: Grossly normal  Lab Results:   Recent Labs  01/21/15 1309  WBC 8.2  HGB 16.1*  HCT 47.2*  PLT 241   BMET  Recent Labs  01/21/15 1309  NA 134*  K 4.6  CL 96  CO2 29  GLUCOSE 121*  BUN 14  CREATININE 0.62  CALCIUM 9.5   Cardiac Panel (last 3 results)  Recent Labs  01/21/15 1644 01/21/15 2146 01/22/15 0413  TROPONINI <0.03 <0.03 <0.03    Studies/Results: NST- results pending  Assessment/Plan    Active Problems:   Chest pain   Atypical chest pain   Morbid obesity   1. Atypical Chest Pain: Currently CP free. Cardiac enzymes negative x 3. EKGs with PVCs but otherwise unremarkable. NUC low risk no ischemia.   2. HTN: Well controlled on HCTZ and Bystolic. Renal has been following. Doing well.   3. Obesity - weight loss.   Dispo: DC   LOS: 2 days    Candee Furbish, MD

## 2015-01-26 ENCOUNTER — Ambulatory Visit (HOSPITAL_COMMUNITY)
Admission: RE | Admit: 2015-01-26 | Discharge: 2015-01-26 | Disposition: A | Discharge status: Home / Self Care | Payer: BLUE CROSS/BLUE SHIELD | Source: Ambulatory Visit | Attending: Family Medicine | Admitting: Family Medicine

## 2015-01-26 DIAGNOSIS — R109 Unspecified abdominal pain: Secondary | ICD-10-CM | POA: Diagnosis not present

## 2015-06-02 LAB — LIPID PANEL
Cholesterol: 215 — AB (ref 0–200)
HDL: 48 (ref 35–70)
LDL Cholesterol: 139
Triglycerides: 140 (ref 40–160)

## 2015-06-04 ENCOUNTER — Encounter: Payer: Self-pay | Admitting: *Deleted

## 2016-11-22 ENCOUNTER — Other Ambulatory Visit: Payer: Self-pay | Admitting: Family Medicine

## 2016-11-22 ENCOUNTER — Other Ambulatory Visit (HOSPITAL_COMMUNITY)
Admission: RE | Admit: 2016-11-22 | Discharge: 2016-11-22 | Disposition: A | Discharge status: Home / Self Care | Payer: BLUE CROSS/BLUE SHIELD | Source: Ambulatory Visit | Attending: Family Medicine | Admitting: Family Medicine

## 2016-11-22 DIAGNOSIS — Z01419 Encounter for gynecological examination (general) (routine) without abnormal findings: Secondary | ICD-10-CM | POA: Diagnosis present

## 2016-11-24 LAB — CYTOLOGY - PAP: Diagnosis: NEGATIVE

## 2017-11-22 LAB — HEPATIC FUNCTION PANEL
ALK PHOS: 43 (ref 25–125)
ALT: 25 (ref 7–35)
AST: 23 (ref 13–35)
Bilirubin, Total: 0.6

## 2018-01-17 LAB — VITAMIN D 25 HYDROXY (VIT D DEFICIENCY, FRACTURES): VIT D 25 HYDROXY: 49.7

## 2018-01-17 LAB — CBC AND DIFFERENTIAL
HEMATOCRIT: 47 — AB (ref 36–46)
Hemoglobin: 15.4 (ref 12.0–16.0)
PLATELETS: 289 (ref 150–399)
WBC: 7.3

## 2018-01-17 LAB — BASIC METABOLIC PANEL
BUN: 12 (ref 4–21)
Creatinine: 0.6 (ref 0.5–1.1)
GLUCOSE: 109
Potassium: 4.7 (ref 3.4–5.3)
SODIUM: 138 (ref 137–147)

## 2018-05-24 ENCOUNTER — Ambulatory Visit (INDEPENDENT_AMBULATORY_CARE_PROVIDER_SITE_OTHER): Payer: Medicare Other | Admitting: Family Medicine

## 2018-05-24 ENCOUNTER — Encounter: Payer: Self-pay | Admitting: Family Medicine

## 2018-05-24 ENCOUNTER — Other Ambulatory Visit: Payer: Self-pay

## 2018-05-24 VITALS — BP 130/78 | HR 80 | Temp 98.0°F | Ht 64.0 in | Wt 284.0 lb

## 2018-05-24 DIAGNOSIS — M1990 Unspecified osteoarthritis, unspecified site: Secondary | ICD-10-CM | POA: Insufficient documentation

## 2018-05-24 DIAGNOSIS — R011 Cardiac murmur, unspecified: Secondary | ICD-10-CM | POA: Insufficient documentation

## 2018-05-24 DIAGNOSIS — K219 Gastro-esophageal reflux disease without esophagitis: Secondary | ICD-10-CM | POA: Insufficient documentation

## 2018-05-24 DIAGNOSIS — B1910 Unspecified viral hepatitis B without hepatic coma: Secondary | ICD-10-CM | POA: Insufficient documentation

## 2018-05-24 DIAGNOSIS — I10 Essential (primary) hypertension: Secondary | ICD-10-CM | POA: Diagnosis not present

## 2018-05-24 DIAGNOSIS — F419 Anxiety disorder, unspecified: Secondary | ICD-10-CM | POA: Diagnosis not present

## 2018-05-24 DIAGNOSIS — N028 Recurrent and persistent hematuria with other morphologic changes: Secondary | ICD-10-CM | POA: Diagnosis not present

## 2018-05-24 DIAGNOSIS — G729 Myopathy, unspecified: Secondary | ICD-10-CM

## 2018-05-24 DIAGNOSIS — E2839 Other primary ovarian failure: Secondary | ICD-10-CM

## 2018-05-24 NOTE — Assessment & Plan Note (Signed)
Stable.  She will follow-up with her nephrologist later this month.  She is currently on ACE inhibitor.  She is also on 12 g of fish oil daily which seems to be helping.  Will not make any changes to her medications.  Defer further management to nephrology.

## 2018-05-24 NOTE — Progress Notes (Signed)
Subjective:  [name] is a 67 y.o. female who presents today with a chief complaint of hypertension and to establish care.   HPI:  Hypertension, chronic problem, new to provider Several year history.  Currently on benazepril 40 mg daily, Bystolic 2.5 mg daily, Procardia 10 mg daily, and spironolactone 25 mg daily.  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  She Has done well on these medications and her blood pressure is stable.   Anxiety, chronic problem, new to provider Several year history.  Uses Ativan very infrequently.  She uses only a few times per month.  Her symptoms are stable.  IgA nephropathy, chronic problem, new to provider Sees a nephrologist in [place] for this.  Symptoms seem to be well-controlled.  She additionally takes 12g of fish oil daily for this.  ROS: Per HPI, otherwise a complete review of systems was negative.   PMH:  The following were reviewed and entered/updated in epic: Past Medical History:  Diagnosis Date  . Anxiety   . Arthritis    "painful joints" (01/21/2015)  . GERD (gastroesophageal reflux disease)   . Headache    "at least q other day lately" (01/21/2015)  . Heart murmur   . Hepatitis B ~ 1992  . History of gout   . Hypertension   . IgAN (IgA nephropathy)   . Migraine X 1  . Rheumatic fever ~ 1967   Patient Active Problem List   Diagnosis Date Noted  . Anxiety 05/24/2018  . Myopathy 05/24/2018  . Hepatitis B   . Heart murmur   . IgAN (IgA nephropathy)   . GERD (gastroesophageal reflux disease)   . Arthritis   . Morbid obesity ([place])   . Hypertension 01/04/2013  . History of rheumatic fever 01/04/2013   Past Surgical History:  Procedure Laterality Date  . DILATION AND CURETTAGE OF UTERUS  ~ 1965  . ERCP  ~ 1993  . LAPAROSCOPIC CHOLECYSTECTOMY  ~ 1983    Family History  Problem Relation Age of Onset  . COPD Mother   . Kidney disease Father   . Heart Problems Brother   .  Hypertension Brother   . Hyperlipidemia Brother   . Other Maternal Grandmother        natural causes  . Stroke Maternal Grandfather   . Other Paternal Grandmother        natural causes  . Cancer Paternal Grandfather   . Coronary artery disease Other        Half brother    Medications- reviewed and updated Current Outpatient Medications  Medication Sig Dispense Refill  . b complex vitamins tablet Take 1 tablet by mouth daily.    . benazepril (LOTENSIN) 40 MG tablet Take 40 mg by mouth daily.    . Coenzyme Q10 (COQ-10) 200 MG CAPS Take 1 capsule by mouth daily.    Marland Kitchen LORazepam (ATIVAN) 0.5 MG tablet Take 0.5 mg by mouth every 8 (eight) hours as needed for anxiety.    . Magnesium 250 MG TABS Take 250 mg by mouth 2 (two) times daily.    . nebivolol (BYSTOLIC) 5 MG tablet Take 2.5 mg by mouth daily.     Marland Kitchen NIFEdipine (PROCARDIA) 10 MG capsule Take 10 mg by mouth once.    Marland Kitchen omega-3 acid ethyl esters (LOVAZA) 1 G capsule Take 6 g by mouth 2 (two) times daily.    Marland Kitchen OVER THE COUNTER MEDICATION Take 450 mg by mouth daily. Tumeric    .  spironolactone (ALDACTONE) 25 MG tablet Take 25 mg by mouth daily.    . Vitamin D, Ergocalciferol, (DRISDOL) 50000 UNITS CAPS Take 50,000 Units by mouth every 7 (seven) days. Takes on Wednesday    . vitamin E 400 UNIT capsule Take 400 Units by mouth daily.     No current facility-administered medications for this visit.     Allergies-reviewed and updated Allergies  Allergen Reactions  . Clonidine Derivatives Other (See Comments)    SEVERE negative reaction, waves of heat over body, very elevated BP  . Flagyl [Metronidazole] Nausea And Vomiting  . Hydralazine Other (See Comments)    SEVERE negative reaction  . Statins Other (See Comments)    Muscle weakness in arms  . Sulfa Antibiotics Rash    Turns red    Social History   Socioeconomic History  . Marital status: Married    Spouse name: Not on file  . Number of children: 0  . Years of education:  Not on file  . Highest education level: Not on file  Occupational History  . Not on file  Social Needs  . Financial resource strain: Not on file  . Food insecurity:    Worry: Not on file    Inability: Not on file  . Transportation needs:    Medical: Not on file    Non-medical: Not on file  Tobacco Use  . Smoking status: Never Smoker  . Smokeless tobacco: Never Used  Substance and Sexual Activity  . Alcohol use: Yes    Comment: 01/21/2015 "1, 12oz beer/month; if that"  . Drug use: No  . Sexual activity: Yes  Lifestyle  . Physical activity:    Days per week: Not on file    Minutes per session: Not on file  . Stress: Not on file  Relationships  . Social connections:    Talks on phone: Not on file    Gets together: Not on file    Attends religious service: Not on file    Active member of club or organization: Not on file    Attends meetings of clubs or organizations: Not on file    Relationship status: Not on file  Other Topics Concern  . Not on file  Social History Narrative  . Not on file    Objective:  Physical Exam: BP 130/78 (BP Location: Left Arm, Patient Position: Sitting, Cuff Size: Large)   Pulse 80   Temp 98 F (36.7 C) (Oral)   Ht 5\' 4"  (1.626 m)   Wt 284 lb (128.8 kg)   SpO2 97%   BMI 48.75 kg/m   Gen: NAD, resting comfortably CV: RRR with no murmurs appreciated Pulm: NWOB, CTAB with no crackles, wheezes, or rhonchi GI: Normal bowel sounds present. Soft, Nontender, Nondistended. MSK: No edema, cyanosis, or clubbing noted Skin: Warm, dry Neuro: Grossly normal, moves all extremities Psych: Normal affect and thought content  Assessment/Plan:  Morbid obesity ([place]) BMI 48 today.  Discussed lifestyle interventions including regular exercise and healthy diet.  IgAN (IgA nephropathy) Stable.  She will follow-up with her nephrologist later this month.  She is currently on ACE inhibitor.  She is also on 12 g of fish oil daily which seems to be helping.   Will not make any changes to her medications.  Defer further management to nephrology.  Hypertension At goal.  Continue benazepril 40 mg daily, nebivolol 2.5 mg daily, Procardia 10 mg daily, and spironolactone 25 mg daily.  Anxiety Stable.  Continue Ativan 0.5 mg  as needed.  Given very infrequent use, she does not need any other medications at this point.  Myopathy Statin induced.  Patient declines use of statins.  Has residual right arm weakness.  Preventative healthcare Patient reports she is up-to-date on her Cologuard.  Will obtain her prior records.  She declined mammogram stating "it has the potential to crush milk ducts and cause cancer."  She also refuses vaccines due to her IgA nephropathy.  We will order DEXA scan today.  She will return soon for blood draw with lipid panel and hepatitis C antibody.  Algis Greenhouse. [name], MD 05/24/2018 11:26 AM

## 2018-05-24 NOTE — Assessment & Plan Note (Addendum)
Statin induced.  Patient declines use of statins.  Has residual right arm weakness.

## 2018-05-24 NOTE — Assessment & Plan Note (Signed)
Stable.  Continue Ativan 0.5 mg as needed.  Given very infrequent use, she does not need any other medications at this point.

## 2018-05-24 NOTE — Patient Instructions (Signed)
It was very nice to see you today!  Your blood pressure looks great. We will not make any changes today to your medications.  Please check to make sure your cologuard is up to date.   Please schedule your DEXA scan today.  Come back to see me in 6-12 months, or sooner as needed. We can check blood work at your next visit. This is usually better done as a fasting lab.   Take care, Dr [name]

## 2018-05-24 NOTE — Assessment & Plan Note (Signed)
BMI 48 today.  Discussed lifestyle interventions including regular exercise and healthy diet.

## 2018-05-24 NOTE — Assessment & Plan Note (Signed)
At goal.  Continue benazepril 40 mg daily, nebivolol 2.5 mg daily, Procardia 10 mg daily, and spironolactone 25 mg daily.

## 2018-05-30 ENCOUNTER — Encounter: Payer: Self-pay | Admitting: Physical Therapy

## 2018-05-31 ENCOUNTER — Inpatient Hospital Stay: Admission: RE | Admit: 2018-05-31 | Payer: Medicare Other | Source: Ambulatory Visit

## 2018-07-10 ENCOUNTER — Other Ambulatory Visit: Payer: Self-pay | Admitting: Family Medicine

## 2018-07-10 ENCOUNTER — Encounter: Payer: Self-pay | Admitting: Family Medicine

## 2018-07-11 LAB — BASIC METABOLIC PANEL
BUN: 10 (ref 4–21)
Creatinine: 0.5 (ref 0.5–1.1)
Glucose: 101
POTASSIUM: 4.1 (ref 3.4–5.3)
SODIUM: 136 — AB (ref 137–147)

## 2018-07-11 NOTE — Telephone Encounter (Signed)
Please advise 

## 2018-08-09 ENCOUNTER — Encounter: Payer: Self-pay | Admitting: Family Medicine

## 2018-08-09 ENCOUNTER — Ambulatory Visit (INDEPENDENT_AMBULATORY_CARE_PROVIDER_SITE_OTHER): Payer: Medicare Other | Admitting: Family Medicine

## 2018-08-09 VITALS — BP 136/94 | HR 78 | Temp 98.2°F | Ht 64.0 in | Wt 281.2 lb

## 2018-08-09 DIAGNOSIS — R0982 Postnasal drip: Secondary | ICD-10-CM | POA: Diagnosis not present

## 2018-08-09 LAB — POCT RAPID STREP A (OFFICE): Rapid Strep A Screen: NEGATIVE

## 2018-08-09 MED ORDER — FLUTICASONE PROPIONATE 50 MCG/ACT NA SUSP: 2.0000 | Freq: Every day | NASAL | 6 refills | Status: DC

## 2018-08-09 NOTE — Progress Notes (Signed)
Patient: [name] MRN: [id number] DOB: 09-Mar-1951 PCP: Vivi Barrack, MD     Subjective:  Chief Complaint  Patient presents with  . Sore Throat    x 2 wks  . Cough    HPI: The patient is a 67 y.o. female who presents today for sore throat for 2 weeks and a cough for 2 weeks. She states her cough is productive and is white in nature. No shortness of breath or wheezing with the cough. She has had an increase in her temperature (98.6). She has a little ear pain mainly in her right ear. No nasal congestion/sinus pain or pressure. She does not smoke and has no history of asthma. She has been around a sick friend 2.5 weeks ago. They had strep throat. She has taken robitussin DM and halls cough drops and tylenol. She feels worse because her blood pressure seems to be going up.   Review of Systems  Constitutional: Positive for fatigue and fever. Negative for chills.  HENT: Positive for ear pain, rhinorrhea and sore throat. Negative for congestion, sinus pressure and sinus pain.        C/o slight ear pain right ear  Respiratory: Positive for cough. Negative for shortness of breath.   Cardiovascular: Negative for chest pain.  Gastrointestinal: Negative for abdominal pain, nausea and vomiting.  Musculoskeletal: Positive for back pain and neck pain.  Neurological: Positive for headaches. Negative for dizziness.  Psychiatric/Behavioral: Negative for sleep disturbance.    Allergies Patient is allergic to clonidine derivatives; flagyl [metronidazole]; hydralazine; statins; and sulfa antibiotics.  Past Medical History Patient  has a past medical history of Anxiety, Arthritis, GERD (gastroesophageal reflux disease), Headache, Heart murmur, Hepatitis B (~ 1992), History of gout, Hypertension, IgAN (IgA nephropathy), Migraine (X 1), and Rheumatic fever (~ 1967).  Surgical History Patient  has a past surgical history that includes ERCP (~ 1993); Laparoscopic cholecystectomy (~ 1983); and  Dilation and curettage of uterus (~ 1965).  Family History Pateint's family history includes COPD in her mother; Cancer in her paternal grandfather; Coronary artery disease in her other; Heart Problems in her brother; Hyperlipidemia in her brother; Hypertension in her brother; Kidney disease in her father; Other in her maternal grandmother and paternal grandmother; Stroke in her maternal grandfather.  Social History Patient  reports that she has never smoked. She has never used smokeless tobacco. She reports that she drinks alcohol. She reports that she does not use drugs.    Objective: Vitals:   08/09/18 1055 08/09/18 1102  BP: (!) 148/90 (!) 136/94  Pulse: 78   Temp: 98.2 F (36.8 C)   TempSrc: Oral   SpO2: 96%   Weight: 281 lb 3.2 oz (127.6 kg)   Height: 5\' 4"  (1.626 m)     Body mass index is 48.27 kg/m.  Physical Exam  Constitutional:  obese  HENT:  Right Ear: Hearing, tympanic membrane and ear canal normal.  Left Ear: Hearing, tympanic membrane and ear canal normal.  Mouth/Throat: Mucous membranes are normal. No uvula swelling. Posterior oropharyngeal erythema present. No oropharyngeal exudate. Tonsils are 0 on the right. Tonsils are 0 on the left. No tonsillar exudate.  +cobblestoning   Cardiovascular: Normal rate and regular rhythm.  Murmur heard. Pulmonary/Chest: Effort normal and breath sounds normal. She has no wheezes. She has no rales.  Abdominal: Soft. Bowel sounds are normal.  Lymphadenopathy:    She has no cervical adenopathy.  Vitals reviewed.   Rapid strep: negative Records dropped off on PCP nurses  desk.   Assessment/plan: 1. Post-nasal drip Reassured her that no signs of any kind of bacterial infection. Cough and sore throat could be from post nasal drip with her cobblestoning on posterior pharynx. Trial of flonase, cool mist humidifier, nasal saline and gurgling with warm salt water. Precautions given. She is to let us know if not getting better or if  symptoms change/worsen.  - POCT rapid strep A    Return if symptoms worsen or fail to improve.    Orma Flaming, MD [facility]   08/09/2018

## 2018-08-09 NOTE — Patient Instructions (Signed)

## 2018-08-10 ENCOUNTER — Ambulatory Visit (INDEPENDENT_AMBULATORY_CARE_PROVIDER_SITE_OTHER)
Admission: RE | Admit: 2018-08-10 | Discharge: 2018-08-10 | Disposition: A | Discharge status: Home / Self Care | Payer: Medicare Other | Source: Ambulatory Visit | Attending: Family Medicine | Admitting: Family Medicine

## 2018-08-10 DIAGNOSIS — E2839 Other primary ovarian failure: Secondary | ICD-10-CM | POA: Diagnosis not present

## 2018-08-10 NOTE — Progress Notes (Signed)
Dr Marigene Ehlers interpretation of your lab work:  Your bone density scan is NORMAL.  Please continue taking your calcium and vitamin D supplements. We can recheck again in 2 years.   If you have any additional questions, please give [place] a call or send [place] a message through [place].  Take care, Dr [name]

## 2018-08-23 ENCOUNTER — Encounter: Payer: Self-pay | Admitting: Physical Therapy

## 2018-10-15 ENCOUNTER — Encounter: Payer: Self-pay | Admitting: Family Medicine

## 2018-10-15 ENCOUNTER — Ambulatory Visit (INDEPENDENT_AMBULATORY_CARE_PROVIDER_SITE_OTHER): Payer: Medicare Other | Admitting: Family Medicine

## 2018-10-15 VITALS — BP 134/86 | HR 68 | Temp 99.2°F | Ht 64.0 in | Wt 275.8 lb

## 2018-10-15 DIAGNOSIS — L03211 Cellulitis of face: Secondary | ICD-10-CM | POA: Diagnosis not present

## 2018-10-15 MED ORDER — DOXYCYCLINE HYCLATE 100 MG PO TABS: 100.0000 mg | ORAL_TABLET | Freq: Two times a day (BID) | ORAL | 0 refills | Status: DC

## 2018-10-15 NOTE — Progress Notes (Signed)
   Subjective:  [name] is a 67 y.o. female who presents today for same-day appointment with a chief complaint of skin lesion.   HPI:  Skin Lesion, Acute problem Started 2 to 3 days ago.  Located in the middle of forehead.  Thinks she was bit by an insect miscarriage may have been a brown recluse.  She did not see the insect that bit her.  Things have worsened over the last couple of days.  Has worsening pain and redness to the area.  Also has some swollen lymph nodes around her neck.  Some swelling around her eyes.  She has tried taking Tylenol and Benadryl which is helped some with her symptoms.  She has had some fevers and chills since yesterday.  No other treatments tried.  No other obvious alleviating or aggravating factors.  ROS: Per HPI  PMH: She reports that she has never smoked. She has never used smokeless tobacco. She reports that she drinks alcohol. She reports that she does not use drugs.  Objective:  Physical Exam: BP 134/86 (BP Location: Left Arm, Patient Position: Sitting, Cuff Size: Large)   Pulse 68   Temp 99.2 F (37.3 C) (Oral)   Ht 5\' 4"  (1.626 m)   Wt 275 lb 12.8 oz (125.1 kg)   LMP  (LMP Unknown)   SpO2 97%   BMI 47.34 kg/m   Gen: NAD, resting comfortably Skin: Approximately 4cm erythematous area with central eschar on central forehead.  No fluctuance. HEENT: Extraocular eye movements intact without pain.  Bilateral submandibular lymphadenopathy noted.  Anterior auricular lymphadenopathy also noted.  Assessment/Plan:  Facial cellulitis No signs or symptoms concerning for orbital cellulitis or eye involvement.  No areas amenable to I&D today.  We will start 2-week course of doxycycline given the location of her rash.  Discussed strict reasons to return to care and reasons to seek emergent care.  Follow-up as needed.  Algis Greenhouse. [name], MD 10/15/2018 9:05 AM

## 2018-10-15 NOTE — Patient Instructions (Addendum)
It was very nice to see you today!  You have an infection in your skin.  Please start the doxycycline 1 pill twice daily for the next 2 weeks.  Please let me know if your symptoms worsen or do not improve over the next few days.  Take care, Dr [name]   Cellulitis, Adult Cellulitis is a skin infection. The infected area is usually red and sore. This condition occurs most often in the arms and lower legs. It is very important to get treated for this condition. Follow these instructions at home:  Take over-the-counter and prescription medicines only as told by your doctor.  If you were prescribed an antibiotic medicine, take it as told by your doctor. Do not stop taking the antibiotic even if you start to feel better.  Drink enough fluid to keep your pee (urine) clear or pale yellow.  Do not touch or rub the infected area.  Raise (elevate) the infected area above the level of your heart while you are sitting or lying down.  Place warm or cold wet cloths (warm or cold compresses) on the infected area. Do this as told by your doctor.  Keep all follow-up visits as told by your doctor. This is important. These visits let your doctor make sure your infection is not getting worse. Contact a doctor if:  You have a fever.  Your symptoms do not get better after 1-2 days of treatment.  Your bone or joint under the infected area starts to hurt after the skin has healed.  Your infection comes back. This can happen in the same area or another area.  You have a swollen bump in the infected area.  You have new symptoms.  You feel ill and also have muscle aches and pains. Get help right away if:  Your symptoms get worse.  You feel very sleepy.  You throw up (vomit) or have watery poop (diarrhea) for a long time.  There are red streaks coming from the infected area.  Your red area gets larger.  Your red area turns darker. This information is not intended to replace advice given to  you by your health care provider. Make sure you discuss any questions you have with your health care provider. Document Released: 04/18/2008 Document Revised: 04/07/2016 Document Reviewed: 09/09/2015 Elsevier Interactive Patient Education  2018 [employer].

## 2018-11-13 LAB — BASIC METABOLIC PANEL: Glucose: 96

## 2019-01-24 ENCOUNTER — Other Ambulatory Visit: Payer: Self-pay | Admitting: Family Medicine

## 2019-01-24 NOTE — Telephone Encounter (Signed)
Please advise 

## 2019-04-17 LAB — BASIC METABOLIC PANEL
BUN: 9 (ref 4–21)
Creatinine: 0.5 (ref 0.5–1.1)
Potassium: 4.4 (ref 3.4–5.3)
Sodium: 132 — AB (ref 137–147)

## 2019-04-17 LAB — CBC AND DIFFERENTIAL
HCT: 44 (ref 36–46)
Platelets: 243 (ref 150–399)
WBC: 5.9

## 2019-05-27 ENCOUNTER — Other Ambulatory Visit: Payer: Self-pay | Admitting: Family Medicine

## 2019-07-02 ENCOUNTER — Other Ambulatory Visit: Payer: Self-pay

## 2019-07-02 ENCOUNTER — Ambulatory Visit (INDEPENDENT_AMBULATORY_CARE_PROVIDER_SITE_OTHER): Payer: Medicare Other | Admitting: Family Medicine

## 2019-07-02 ENCOUNTER — Encounter: Payer: Self-pay | Admitting: Family Medicine

## 2019-07-02 VITALS — BP 130/82 | HR 85 | Temp 98.2°F | Ht 64.0 in | Wt 277.6 lb

## 2019-07-02 DIAGNOSIS — F419 Anxiety disorder, unspecified: Secondary | ICD-10-CM | POA: Diagnosis not present

## 2019-07-02 DIAGNOSIS — Z6841 Body Mass Index (BMI) 40.0 and over, adult: Secondary | ICD-10-CM

## 2019-07-02 DIAGNOSIS — N028 Recurrent and persistent hematuria with other morphologic changes: Secondary | ICD-10-CM

## 2019-07-02 DIAGNOSIS — I10 Essential (primary) hypertension: Secondary | ICD-10-CM

## 2019-07-02 MED ORDER — LORAZEPAM 0.5 MG PO TABS: ORAL_TABLET | ORAL | 0 refills | Status: DC

## 2019-07-02 NOTE — Progress Notes (Signed)
   Chief Complaint:  [name] is a 68 y.o. female who presents today with a chief complaint of IGA nephropathy .   Assessment/Plan:  Hypertension At goal.  Continue benazepril 40 mg daily, Bystolic 2.5 mg daily, and Procardia 30 mg daily.  Anxiety Stable.  Refilled Ativan 0.5 mg as needed.  IgAN (IgA nephropathy) Reviewed with recent labs with patient.  She will continue her ACE inhibitor and fish oil.  Will check 24-hour urine protein per patient request.  Will place referral to nephrologist here in [place] as well.  Body mass index is 47.65 kg/m. / Morbid Obesity BMI Metric Follow Up - 07/02/19 1615      BMI Metric Follow Up-Please document annually   BMI Metric Follow Up  Education provided         Subjective:  HPI:  IGA Nephropathy  - Follows with nephrology -Recently had biopsy done that showed MAST-C score of 1. -Recent blood work showed normal/stable renal function and electrolytes. - Her current nephrologist wants her to start corticotropin however she is reluctant to do that.  # Essential Hypertension - On benazepril 40mg  daily, bystolic 2.5mg  daily, procardia 30mg  daily and tolerating well - ROS: No reported SI or HI  # Anxiety - On ativan 0.5mg  as needed and tolerating well - ROS: No reported SI or HI  ROS: Per HPI  PMH: She reports that she has never smoked. She has never used smokeless tobacco. She reports current alcohol use. She reports that she does not use drugs.      Objective:  Physical Exam: BP 130/82 (BP Location: Left Arm, Patient Position: Sitting, Cuff Size: Large)   Pulse 85   Temp 98.2 F (36.8 C) (Oral)   Ht 5\' 4"  (1.626 m)   Wt 277 lb 9.6 oz (125.9 kg)   LMP  (LMP Unknown)   SpO2 96%   BMI 47.65 kg/m   Gen: NAD, resting comfortably MSK: No edema, cyanosis, or clubbing noted Skin: Warm, dry Neuro: Grossly normal, moves all extremities Psych: Normal affect and thought content     Caleb M. [name], MD 07/02/2019 4:17  PM

## 2019-07-02 NOTE — Assessment & Plan Note (Signed)
Stable.  Refilled Ativan 0.5 mg as needed.

## 2019-07-02 NOTE — Assessment & Plan Note (Signed)
At goal.  Continue benazepril 40 mg daily, Bystolic 2.5 mg daily, and Procardia 30 mg daily.

## 2019-07-02 NOTE — Patient Instructions (Signed)
It was very nice to see you today!  You recent lab work is reassuring.  We will check a 24 hour urine protein test.  I will refer you to see a different nephrologist.  I will refill your ativan.  Come back to see me in 3-6 months, or sooner if needed.   Take care, Dr [name]  Please try these tips to maintain a healthy lifestyle:   Eat at least 3 REAL meals and 1-2 snacks per day.  Aim for no more than 5 hours between eating.  If you eat breakfast, please do so within one hour of getting up.    Obtain twice as many fruits/vegetables as protein or carbohydrate foods for both lunch and dinner. (Half of each meal should be fruits/vegetables, one quarter protein, and one quarter starchy carbs)   Cut down on sweet beverages. This includes juice, soda, and sweet tea.    Exercise at least 150 minutes every week.

## 2019-07-02 NOTE — Assessment & Plan Note (Signed)
Reviewed with recent labs with patient.  She will continue her ACE inhibitor and fish oil.  Will check 24-hour urine protein per patient request.  Will place referral to nephrologist here in [place] as well.

## 2019-07-03 ENCOUNTER — Other Ambulatory Visit: Payer: Self-pay | Admitting: Family Medicine

## 2019-07-03 ENCOUNTER — Other Ambulatory Visit: Payer: Medicare Other

## 2019-07-03 DIAGNOSIS — N028 Recurrent and persistent hematuria with other morphologic changes: Secondary | ICD-10-CM

## 2019-07-11 ENCOUNTER — Encounter: Payer: Self-pay | Admitting: Family Medicine

## 2019-07-16 ENCOUNTER — Encounter: Payer: Self-pay | Admitting: Family Medicine

## 2019-07-26 ENCOUNTER — Other Ambulatory Visit: Payer: Self-pay

## 2019-07-26 ENCOUNTER — Other Ambulatory Visit: Payer: Medicare Other

## 2019-07-26 DIAGNOSIS — N028 Recurrent and persistent hematuria with other morphologic changes: Secondary | ICD-10-CM

## 2019-07-26 NOTE — Progress Notes (Signed)
pr

## 2019-07-28 ENCOUNTER — Encounter: Payer: Self-pay | Admitting: Family Medicine

## 2019-07-29 ENCOUNTER — Other Ambulatory Visit: Payer: Self-pay

## 2019-07-29 ENCOUNTER — Other Ambulatory Visit: Payer: Medicare Other

## 2019-07-29 DIAGNOSIS — N028 Recurrent and persistent hematuria with other morphologic changes: Secondary | ICD-10-CM

## 2019-07-30 LAB — PROTEIN / CREATININE RATIO, URINE
Creatinine, Urine: 41 mg/dL (ref 20–275)
Protein/Creat Ratio: 854 mg/g creat — ABNORMAL HIGH (ref 21–161)
Protein/Creatinine Ratio: 0.854 mg/mg creat — ABNORMAL HIGH (ref 0.021–0.16)
Total Protein, Urine: 35 mg/dL — ABNORMAL HIGH (ref 5–24)

## 2019-07-30 LAB — PROTEIN, URINE, 24 HOUR: Protein, 24H Urine: 810 mg/24 h — ABNORMAL HIGH (ref 0–149)

## 2019-07-30 LAB — HOUSE ACCOUNT TRACKING

## 2019-07-30 NOTE — Progress Notes (Signed)
Please inform patient of the following:  Protein creatinine ratio is elevated - do not have previous levels to compare with. Do not need to change her plan at this time. Recommend that she follow up with nephrology soon as we have discussed.  [name]. [name], MD 07/30/2019 8:06 AM

## 2019-07-31 NOTE — Progress Notes (Signed)
Please inform patient of the following:  24 hour protein elevated. No priors to compare to. Would like for her to follow up with nephrology soon as we have discussed.  [name]. [name], MD 07/31/2019 10:27 AM

## 2019-09-09 ENCOUNTER — Telehealth: Payer: Self-pay

## 2019-09-09 ENCOUNTER — Other Ambulatory Visit: Payer: Self-pay

## 2019-09-09 DIAGNOSIS — N939 Abnormal uterine and vaginal bleeding, unspecified: Secondary | ICD-10-CM

## 2019-09-09 NOTE — Telephone Encounter (Signed)
Copied from [place] [id number]. Topic: Referral - Request for Referral >> Sep 09, 2019  1:27 PM Burchel, Abbi R wrote: Has patient seen PCP for this complaint? no *If NO, is insurance requiring patient see PCP for this issue before PCP can refer them? Referral for which specialty: [facility] Preferred provider/office: [facility] Reason for referral: Unusual Vaginal Bleeding

## 2019-09-11 NOTE — Telephone Encounter (Signed)
Patient is calling because  Dr. Jerrilyn Cairo office said that they did not get the referral from Dr. [name].  Please advise and resend or call patient to discuss.

## 2019-09-11 NOTE — Telephone Encounter (Signed)
See note

## 2019-09-12 NOTE — Telephone Encounter (Signed)
Referral sent 

## 2019-09-20 LAB — BASIC METABOLIC PANEL
BUN: 10 (ref 4–21)
Chloride: 103 (ref 99–108)
Creatinine: 0.6 (ref 0.5–1.1)
Glucose: 91
Potassium: 4.7 (ref 3.4–5.3)

## 2019-09-20 LAB — HEPATIC FUNCTION PANEL
ALT: 19 (ref 7–35)
AST: 21 (ref 13–35)
Alkaline Phosphatase: 51 (ref 25–125)

## 2019-09-21 ENCOUNTER — Encounter: Payer: Self-pay | Admitting: Family Medicine

## 2019-09-21 LAB — COMPREHENSIVE METABOLIC PANEL
Albumin: 4.1 (ref 3.5–5.0)
Calcium: 9.7 (ref 8.7–10.7)
GFR calc Af Amer: 120
GFR calc non Af Amer: 103
Globulin: 3.1

## 2019-10-14 ENCOUNTER — Other Ambulatory Visit: Payer: Self-pay

## 2019-10-14 ENCOUNTER — Ambulatory Visit (INDEPENDENT_AMBULATORY_CARE_PROVIDER_SITE_OTHER): Payer: Medicare Other

## 2019-10-14 DIAGNOSIS — Z1211 Encounter for screening for malignant neoplasm of colon: Secondary | ICD-10-CM

## 2019-10-14 DIAGNOSIS — Z0000 Encounter for general adult medical examination without abnormal findings: Secondary | ICD-10-CM

## 2019-10-14 NOTE — Progress Notes (Signed)
This visit is being conducted via phone call due to the COVID-19 pandemic. This patient has given me verbal consent via phone to conduct this visit, patient states they are participating from their home address. Some vital signs may be absent or patient reported.   Patient identification: identified by name, DOB, and current address.  Location provider: [facility], Office Persons participating in the virtual visit: Denman George LPN and Dr. Dimas Chyle     Subjective:   [name] is a 68 y.o. female who presents for an Initial Medicare Annual Wellness Visit.  Review of Systems     Cardiac Risk Factors include: advanced age (>20men, >75 women);hypertension    Objective:    There were no vitals filed for this visit. There is no height or weight on file to calculate BMI.  Advanced Directives 10/14/2019 01/21/2015 01/04/2013  Does Patient Have a Medical Advance Directive? No No Patient has advance directive, copy not in chart  Type of Advance Directive - - Living will  [place] in Chart? - - Copy requested from family  Would patient like information on creating a medical advance directive? Yes (MAU/Ambulatory/Procedural Areas - Information given) No - patient declined information -    Current Medications (verified) Outpatient Encounter Medications as of 10/14/2019  Medication Sig  . b complex vitamins tablet Take 1 tablet by mouth daily.  . benazepril (LOTENSIN) 40 MG tablet Take 40 mg by mouth daily.  . Coenzyme Q10 (COQ-10) 200 MG CAPS Take 1 capsule by mouth daily.  Marland Kitchen LORazepam (ATIVAN) 0.5 MG tablet TAKE 1 TABLET BY MOUTH EVERY 8 TO 12 HOURS AS NEEDED  . losartan (COZAAR) 100 MG tablet Take 100 mg by mouth daily.  . Magnesium 250 MG TABS Take 250 mg by mouth 2 (two) times daily.  . nebivolol (BYSTOLIC) 5 MG tablet Take 2.5 mg by mouth daily.   Marland Kitchen NIFEdipine (ADALAT CC) 30 MG 24 hr tablet Take 30 mg by mouth 2 (two) times daily.  Marland Kitchen NIFEdipine  (PROCARDIA-XL/ADALAT-CC/NIFEDICAL-XL) 30 MG 24 hr tablet Take 30 mg by mouth daily.  Marland Kitchen omega-3 acid ethyl esters (LOVAZA) 1 G capsule Take 6 g by mouth 2 (two) times daily.  Marland Kitchen OVER THE COUNTER MEDICATION Take 450 mg by mouth daily. Tumeric  . Vitamin D, Ergocalciferol, (DRISDOL) 50000 UNITS CAPS Take 50,000 Units by mouth every 7 (seven) days. Takes on Wednesday  . vitamin E 400 UNIT capsule Take 400 Units by mouth daily.  . [DISCONTINUED] NIFEdipine (PROCARDIA) 10 MG capsule Take 10 mg by mouth once.   No facility-administered encounter medications on file as of 10/14/2019.     Allergies (verified) Clonidine derivatives, Flagyl [metronidazole], Hydralazine, Statins, and Sulfa antibiotics   History: Past Medical History:  Diagnosis Date  . Anxiety   . Arthritis    "painful joints" (01/21/2015)  . GERD (gastroesophageal reflux disease)   . Headache    "at least q other day lately" (01/21/2015)  . Heart murmur   . Hepatitis B ~ 1992  . History of gout   . Hypertension   . IgAN (IgA nephropathy)   . Migraine X 1  . Rheumatic fever ~ [id number]   Past Surgical History:  Procedure Laterality Date  . DILATION AND CURETTAGE OF UTERUS  ~ 1965  . ERCP  ~ 1993  . LAPAROSCOPIC CHOLECYSTECTOMY  ~ 1983   Family History  Problem Relation Age of Onset  . COPD Mother   . Kidney disease Father   .  Heart Problems Brother   . Hypertension Brother   . Hyperlipidemia Brother   . Other Maternal Grandmother        natural causes  . Stroke Maternal Grandfather   . Other Paternal Grandmother        natural causes  . Cancer Paternal Grandfather   . Coronary artery disease Other        Half brother   Social History   Socioeconomic History  . Marital status: Married    Spouse name: Not on file  . Number of children: 0  . Years of education: Not on file  . Highest education level: Not on file  Occupational History  . Not on file  Social Needs  . Financial resource strain: Not on file  . Food  insecurity    Worry: Not on file    Inability: Not on file  . Transportation needs    Medical: Not on file    Non-medical: Not on file  Tobacco Use  . Smoking status: Never Smoker  . Smokeless tobacco: Never Used  Substance and Sexual Activity  . Alcohol use: Yes    Comment: 01/21/2015 "1, 12oz beer/month; if that"  . Drug use: No  . Sexual activity: Yes  Lifestyle  . Physical activity    Days per week: Not on file    Minutes per session: Not on file  . Stress: Not on file  Relationships  . Social Herbalist on phone: Not on file    Gets together: Not on file    Attends religious service: Not on file    Active member of club or organization: Not on file    Attends meetings of clubs or organizations: Not on file    Relationship status: Not on file  Other Topics Concern  . Not on file  Social History Narrative  . Not on file    Tobacco Counseling Counseling given: Not Answered   Clinical Intake:  Pre-visit preparation completed: Yes  Pain : No/denies pain  Diabetes: No  How often do you need to have someone help you when you read instructions, pamphlets, or other written materials from your doctor or pharmacy?: 1 - Never  Interpreter Needed?: No  Information entered by :: Denman George LPN   Activities of Daily Living In your present state of health, do you have any difficulty performing the following activities: 10/14/2019  Hearing? N  Vision? N  Difficulty concentrating or making decisions? N  Walking or climbing stairs? N  Dressing or bathing? N  Doing errands, shopping? N  Preparing Food and eating ? N  Using the Toilet? N  In the past six months, have you accidently leaked urine? N  Do you have problems with loss of bowel control? N  Managing your Medications? N  Managing your Finances? N  Housekeeping or managing your Housekeeping? N  Some recent data might be hidden     Immunizations and Health Maintenance  There is no  immunization history on file for this patient. Health Maintenance Due  Topic Date Due  . Hepatitis C Screening  [birth year]  . TETANUS/TDAP  09/15/1971  . MAMMOGRAM  09/14/2002  . PNA vac Low Risk Adult (1 of 2 - PCV13) 09/14/2017  . Fecal DNA (Cologuard)  04/19/2018  . INFLUENZA VACCINE  06/15/2019    Patient Care Team: Vivi Barrack, MD as PCP - General (Family Medicine) Murrell Redden Earlyne Iba, MD as Consulting Physician (Obstetrics and Gynecology) Donato Heinz, MD  as Consulting Physician (Nephrology)  Indicate any recent Medical Services you may have received from other than [facility] providers in the past year (date may be approximate).     Assessment:   This is a routine wellness examination for Emersen.  Hearing/Vision screen No exam data present  Dietary issues and exercise activities discussed: Current Exercise Habits: The patient does not participate in regular exercise at present  Goals   None    Depression Screen PHQ 2/9 Scores 10/14/2019 07/02/2019 05/24/2018  PHQ - 2 Score 0 0 0    Fall Risk Fall Risk  10/14/2019  Falls in the past year? 0  Injury with Fall? 0  Follow up Falls evaluation completed;Education provided;Falls prevention discussed    Is the patient's home free of loose throw rugs in walkways, pet beds, electrical cords, etc?   yes      Grab bars in the bathroom? yes      Handrails on the stairs?   yes      Adequate lighting?   yes  Cognitive Function: no cognitive concerns at this time  Cognitive Testing  Alert? Yes         Normal Appearance? n/a Oriented to person? Yes           Place? Yes  Time? Yes  Recall of three objects? Yes  Can perform simple calculations? Yes  Displays appropriate judgment? Yes  Can read the correct time from a watch face? Yes   Screening Tests Health Maintenance  Topic Date Due  . Hepatitis C Screening  09/29/1951  . TETANUS/TDAP  09/15/1971  . MAMMOGRAM  09/14/2002  . PNA vac Low Risk Adult (1 of 2 - PCV13)  09/14/2017  . Fecal DNA (Cologuard)  04/19/2018  . INFLUENZA VACCINE  06/15/2019  . DEXA SCAN  Completed    Qualifies for Shingles Vaccine? Discussed and patient will check with pharmacy for coverage.  Patient education handout provided   Cancer Screenings: Lung: Low Dose CT Chest recommended if Age 70-80 years, 30 pack-year currently smoking OR have quit w/in 15years. Patient does not qualify. Breast: Up to date on Mammogram? No; wants to discuss further with gynecologist  Up to date of Bone Density/Dexa? Yes Colorectal: Cologuard ordered today   Plan:  I have personally reviewed and addressed the Medicare Annual Wellness questionnaire and have noted the following in the patient's chart:  A. Medical and social history B. Use of alcohol, tobacco or illicit drugs  C. Current medications and supplements D. Functional ability and status E.  Nutritional status F.  Physical activity G. Advance directives H. List of other physicians I.  Hospitalizations, surgeries, and ER visits in previous 12 months J.  [place] such as hearing and vision if needed, cognitive and depression L. Referrals, records requested, and appointments- cologuard ordered   In addition, I have reviewed and discussed with patient certain preventive protocols, quality metrics, and best practice recommendations. A written personalized care plan for preventive services as well as general preventive health recommendations were provided to patient.   Signed,  Denman George, LPN  Nurse Health Advisor   Nurse Notes: no additional

## 2019-10-14 NOTE — Patient Instructions (Signed)
[name] , Thank you for taking time to come for your Medicare Wellness Visit. I appreciate your ongoing commitment to your health goals. Please review the following plan we discussed and let me know if I can assist you in the future.   Screening recommendations/referrals: Colorectal Screening: Cologuard ordered today  Mammogram: recommended; please discuss further with Dr Murrell Redden  Bone Density: up to date; last 08/10/18  Vision and Dental Exams: Recommended annual ophthalmology exams for early detection of glaucoma and other disorders of the eye Recommended annual dental exams for proper oral hygiene  Vaccinations: Influenza vaccine:  recommended this fall either at PCP office or through your local pharmacy  Pneumococcal vaccine: recommended Tdap vaccine: recommended; Please call your insurance company to determine your out of pocket expense. You may  receive this vaccine at your local pharmacy or Health Dept. Shingles vaccine: Please call your insurance company to determine your out of pocket expense for the Shingrix vaccine. You may receive this vaccine at your local pharmacy.  Advanced directives: I have provided a copy for you to complete at home and have notarized. Once this is complete please bring a copy in to our office so we can scan it into your chart.  Goals: Recommend to drink at least 6-8 8oz glasses of water per day and consume a balanced diet rich in fresh fruits and vegetables.   Next appointment: Please schedule your Annual Wellness Visit with your Nurse Health Advisor in one year.  Preventive Care 38 Years and Older, Female Preventive care refers to lifestyle choices and visits with your health care provider that can promote health and wellness. What does preventive care include?  A yearly physical exam. This is also called an annual well check.  Dental exams once or twice a year.  Routine eye exams. Ask your health care provider how often you should have your  eyes checked.  Personal lifestyle choices, including:  Daily care of your teeth and gums.  Regular physical activity.  Eating a healthy diet.  Avoiding tobacco and drug use.  Limiting alcohol use.  Practicing safe sex.  Taking low-dose aspirin every day if recommended by your health care provider.  Taking vitamin and mineral supplements as recommended by your health care provider. What happens during an annual well check? The services and screenings done by your health care provider during your annual well check will depend on your age, overall health, lifestyle risk factors, and family history of disease. Counseling  Your health care provider may ask you questions about your:  Alcohol use.  Tobacco use.  Drug use.  Emotional well-being.  Home and relationship well-being.  Sexual activity.  Eating habits.  History of falls.  Memory and ability to understand (cognition).  Work and work Statistician.  Reproductive health. Screening  You may have the following tests or measurements:  Height, weight, and BMI.  Blood pressure.  Lipid and cholesterol levels. These may be checked every 5 years, or more frequently if you are over 15 years old.  Skin check.  Lung cancer screening. You may have this screening every year starting at age 78 if you have a 30-pack-year history of smoking and currently smoke or have quit within the past 15 years.  Fecal occult blood test (FOBT) of the stool. You may have this test every year starting at age 29.  Flexible sigmoidoscopy or colonoscopy. You may have a sigmoidoscopy every 5 years or a colonoscopy every 10 years starting at age 45.  Hepatitis C  blood test.  Hepatitis B blood test.  Sexually transmitted disease (STD) testing.  Diabetes screening. This is done by checking your blood sugar (glucose) after you have not eaten for a while (fasting). You may have this done every 1-3 years.  Bone density scan. This is done to  screen for osteoporosis. You may have this done starting at age 46.  Mammogram. This may be done every 1-2 years. Talk to your health care provider about how often you should have regular mammograms. Talk with your health care provider about your test results, treatment options, and if necessary, the need for more tests. Vaccines  Your health care provider may recommend certain vaccines, such as:  Influenza vaccine. This is recommended every year.  Tetanus, diphtheria, and acellular pertussis (Tdap, Td) vaccine. You may need a Td booster every 10 years.  Zoster vaccine. You may need this after age 17.  Pneumococcal 13-valent conjugate (PCV13) vaccine. One dose is recommended after age 71.  Pneumococcal polysaccharide (PPSV23) vaccine. One dose is recommended after age 68. Talk to your health care provider about which screenings and vaccines you need and how often you need them. This information is not intended to replace advice given to you by your health care provider. Make sure you discuss any questions you have with your health care provider. Document Released: 11/27/2015 Document Revised: 07/20/2016 Document Reviewed: 09/01/2015 Elsevier Interactive Patient Education  2017 [employer] Prevention in the Home Falls can cause injuries. They can happen to people of all ages. There are many things you can do to make your home safe and to help prevent falls. What can I do on the outside of my home?  Regularly fix the edges of walkways and driveways and fix any cracks.  Remove anything that might make you trip as you walk through a door, such as a raised step or threshold.  Trim any bushes or trees on the path to your home.  Use bright outdoor lighting.  Clear any walking paths of anything that might make someone trip, such as rocks or tools.  Regularly check to see if handrails are loose or broken. Make sure that both sides of any steps have handrails.  Any raised decks  and porches should have guardrails on the edges.  Have any leaves, snow, or ice cleared regularly.  Use sand or salt on walking paths during winter.  Clean up any spills in your garage right away. This includes oil or grease spills. What can I do in the bathroom?  Use night lights.  Install grab bars by the toilet and in the tub and shower. Do not use towel bars as grab bars.  Use non-skid mats or decals in the tub or shower.  If you need to sit down in the shower, use a plastic, non-slip stool.  Keep the floor dry. Clean up any water that spills on the floor as soon as it happens.  Remove soap buildup in the tub or shower regularly.  Attach bath mats securely with double-sided non-slip rug tape.  Do not have throw rugs and other things on the floor that can make you trip. What can I do in the bedroom?  Use night lights.  Make sure that you have a light by your bed that is easy to reach.  Do not use any sheets or blankets that are too big for your bed. They should not hang down onto the floor.  Have a firm chair that has side arms. You  can use this for support while you get dressed.  Do not have throw rugs and other things on the floor that can make you trip. What can I do in the kitchen?  Clean up any spills right away.  Avoid walking on wet floors.  Keep items that you use a lot in easy-to-reach places.  If you need to reach something above you, use a strong step stool that has a grab bar.  Keep electrical cords out of the way.  Do not use floor polish or wax that makes floors slippery. If you must use wax, use non-skid floor wax.  Do not have throw rugs and other things on the floor that can make you trip. What can I do with my stairs?  Do not leave any items on the stairs.  Make sure that there are handrails on both sides of the stairs and use them. Fix handrails that are broken or loose. Make sure that handrails are as long as the stairways.  Check any  carpeting to make sure that it is firmly attached to the stairs. Fix any carpet that is loose or worn.  Avoid having throw rugs at the top or bottom of the stairs. If you do have throw rugs, attach them to the floor with carpet tape.  Make sure that you have a light switch at the top of the stairs and the bottom of the stairs. If you do not have them, ask someone to add them for you. What else can I do to help prevent falls?  Wear shoes that:  Do not have high heels.  Have rubber bottoms.  Are comfortable and fit you well.  Are closed at the toe. Do not wear sandals.  If you use a stepladder:  Make sure that it is fully opened. Do not climb a closed stepladder.  Make sure that both sides of the stepladder are locked into place.  Ask someone to hold it for you, if possible.  Clearly mark and make sure that you can see:  Any grab bars or handrails.  First and last steps.  Where the edge of each step is.  Use tools that help you move around (mobility aids) if they are needed. These include:  Canes.  Walkers.  Scooters.  Crutches.  Turn on the lights when you go into a dark area. Replace any light bulbs as soon as they burn out.  Set up your furniture so you have a clear path. Avoid moving your furniture around.  If any of your floors are uneven, fix them.  If there are any pets around you, be aware of where they are.  Review your medicines with your doctor. Some medicines can make you feel dizzy. This can increase your chance of falling. Ask your doctor what other things that you can do to help prevent falls. This information is not intended to replace advice given to you by your health care provider. Make sure you discuss any questions you have with your health care provider. Document Released: 08/27/2009 Document Revised: 04/07/2016 Document Reviewed: 12/05/2014 Elsevier Interactive Patient Education  2017 [employer].

## 2019-10-15 NOTE — Progress Notes (Signed)
I have personally reviewed the Medicare Annual Wellness Visit and agree with the assessment and plan.  Algis Greenhouse. [name], MD 10/15/2019 7:49 AM

## 2019-12-18 ENCOUNTER — Other Ambulatory Visit: Payer: Self-pay

## 2019-12-18 ENCOUNTER — Ambulatory Visit (INDEPENDENT_AMBULATORY_CARE_PROVIDER_SITE_OTHER): Payer: Medicare Other | Admitting: Family Medicine

## 2019-12-18 ENCOUNTER — Encounter: Payer: Self-pay | Admitting: Family Medicine

## 2019-12-18 VITALS — BP 128/74 | HR 76 | Temp 97.4°F | Ht 64.0 in | Wt 274.2 lb

## 2019-12-18 DIAGNOSIS — I10 Essential (primary) hypertension: Secondary | ICD-10-CM

## 2019-12-18 DIAGNOSIS — Z13220 Encounter for screening for lipoid disorders: Secondary | ICD-10-CM

## 2019-12-18 DIAGNOSIS — D219 Benign neoplasm of connective and other soft tissue, unspecified: Secondary | ICD-10-CM

## 2019-12-18 DIAGNOSIS — Z1211 Encounter for screening for malignant neoplasm of colon: Secondary | ICD-10-CM

## 2019-12-18 DIAGNOSIS — N028 Recurrent and persistent hematuria with other morphologic changes: Secondary | ICD-10-CM

## 2019-12-18 DIAGNOSIS — R739 Hyperglycemia, unspecified: Secondary | ICD-10-CM | POA: Diagnosis not present

## 2019-12-18 DIAGNOSIS — F419 Anxiety disorder, unspecified: Secondary | ICD-10-CM

## 2019-12-18 DIAGNOSIS — Z1159 Encounter for screening for other viral diseases: Secondary | ICD-10-CM

## 2019-12-18 LAB — LIPID PANEL
Cholesterol: 220 mg/dL — ABNORMAL HIGH (ref 0–200)
HDL: 50.6 mg/dL (ref >39.00)
LDL Cholesterol: 144 mg/dL — ABNORMAL HIGH (ref 0–99)
NonHDL: 169.81
Total CHOL/HDL Ratio: 4
Triglycerides: 128 mg/dL (ref 0.0–149.0)
VLDL: 25.6 mg/dL (ref 0.0–40.0)

## 2019-12-18 LAB — COMPREHENSIVE METABOLIC PANEL
ALT: 20 U/L (ref 0–35)
AST: 23 U/L (ref 0–37)
Albumin: 4.1 g/dL (ref 3.5–5.2)
Alkaline Phosphatase: 54 U/L (ref 39–117)
BUN: 10 mg/dL (ref 6–23)
CO2: 27 mEq/L (ref 19–32)
Calcium: 9.6 mg/dL (ref 8.4–10.5)
Chloride: 101 mEq/L (ref 96–112)
Creatinine, Ser: 0.62 mg/dL (ref 0.40–1.20)
GFR: 95.94 mL/min (ref >60.00)
Glucose, Bld: 94 mg/dL (ref 70–99)
Potassium: 4.4 mEq/L (ref 3.5–5.1)
Sodium: 139 mEq/L (ref 135–145)
Total Bilirubin: 0.8 mg/dL (ref 0.2–1.2)
Total Protein: 7.2 g/dL (ref 6.0–8.3)

## 2019-12-18 LAB — CBC
HCT: 46.2 % — ABNORMAL HIGH (ref 36.0–46.0)
Hemoglobin: 15.5 g/dL — ABNORMAL HIGH (ref 12.0–15.0)
MCHC: 33.5 g/dL (ref 30.0–36.0)
MCV: 90.6 fl (ref 78.0–100.0)
Platelets: 237 10*3/uL (ref 150.0–400.0)
RBC: 5.1 Mil/uL (ref 3.87–5.11)
RDW: 12.9 % (ref 11.5–15.5)
WBC: 7.3 10*3/uL (ref 4.0–10.5)

## 2019-12-18 LAB — TSH: TSH: 1.23 u[IU]/mL (ref 0.35–4.50)

## 2019-12-18 LAB — HEMOGLOBIN A1C: Hgb A1c MFr Bld: 5.7 % (ref 4.6–6.5)

## 2019-12-18 MED ORDER — LORAZEPAM 0.5 MG PO TABS: ORAL_TABLET | ORAL | 0 refills | Status: DC

## 2019-12-18 NOTE — Patient Instructions (Signed)
It was very nice to see you today!  I am glad that you are doing well.  We will check blood work today.  We will get you set up to have a Cologuard as well.  Come back in 1 year for your next checkup, or sooner if needed.  Take care, Dr [name]  Please try these tips to maintain a healthy lifestyle:   Eat at least 3 REAL meals and 1-2 snacks per day.  Aim for no more than 5 hours between eating.  If you eat breakfast, please do so within one hour of getting up.    Each meal should contain half fruits/vegetables, one quarter protein, and one quarter carbs (no bigger than a computer mouse)   Cut down on sweet beverages. This includes juice, soda, and sweet tea.     Drink at least 1 glass of water with each meal and aim for at least 8 glasses per day   Exercise at least 150 minutes every week.    Preventive Care 68 Years and Older, Female Preventive care refers to lifestyle choices and visits with your health care provider that can promote health and wellness. This includes:  A yearly physical exam. This is also called an annual well check.  Regular dental and eye exams.  Immunizations.  Screening for certain conditions.  Healthy lifestyle choices, such as diet and exercise. What can I expect for my preventive care visit? Physical exam Your health care provider will check:  Height and weight. These may be used to calculate body mass index (BMI), which is a measurement that tells if you are at a healthy weight.  Heart rate and blood pressure.  Your skin for abnormal spots. Counseling Your health care provider may ask you questions about:  Alcohol, tobacco, and drug use.  Emotional well-being.  Home and relationship well-being.  Sexual activity.  Eating habits.  History of falls.  Memory and ability to understand (cognition).  Work and work Statistician.  Pregnancy and menstrual history. What immunizations do I need?  Influenza (flu) vaccine  This  is recommended every year. Tetanus, diphtheria, and pertussis (Tdap) vaccine  You may need a Td booster every 10 years. Varicella (chickenpox) vaccine  You may need this vaccine if you have not already been vaccinated. Zoster (shingles) vaccine  You may need this after age 46. Pneumococcal conjugate (PCV13) vaccine  One dose is recommended after age 27. Pneumococcal polysaccharide (PPSV23) vaccine  One dose is recommended after age 35. Measles, mumps, and rubella (MMR) vaccine  You may need at least one dose of MMR if you were born in 1957 or later. You may also need a second dose. Meningococcal conjugate (MenACWY) vaccine  You may need this if you have certain conditions. Hepatitis A vaccine  You may need this if you have certain conditions or if you travel or work in places where you may be exposed to hepatitis A. Hepatitis B vaccine  You may need this if you have certain conditions or if you travel or work in places where you may be exposed to hepatitis B. Haemophilus influenzae type b (Hib) vaccine  You may need this if you have certain conditions. You may receive vaccines as individual doses or as more than one vaccine together in one shot (combination vaccines). Talk with your health care provider about the risks and benefits of combination vaccines. What tests do I need? Blood tests  Lipid and cholesterol levels. These may be checked every 5 years, or more  frequently depending on your overall health.  Hepatitis C test.  Hepatitis B test. Screening  Lung cancer screening. You may have this screening every year starting at age 28 if you have a 30-pack-year history of smoking and currently smoke or have quit within the past 15 years.  Colorectal cancer screening. All adults should have this screening starting at age 81 and continuing until age 41. Your health care provider may recommend screening at age 44 if you are at increased risk. You will have tests every 1-10  years, depending on your results and the type of screening test.  Diabetes screening. This is done by checking your blood sugar (glucose) after you have not eaten for a while (fasting). You may have this done every 1-3 years.  Mammogram. This may be done every 1-2 years. Talk with your health care provider about how often you should have regular mammograms.  BRCA-related cancer screening. This may be done if you have a family history of breast, ovarian, tubal, or peritoneal cancers. Other tests  Sexually transmitted disease (STD) testing.  Bone density scan. This is done to screen for osteoporosis. You may have this done starting at age 66. Follow these instructions at home: Eating and drinking  Eat a diet that includes fresh fruits and vegetables, whole grains, lean protein, and low-fat dairy products. Limit your intake of foods with high amounts of sugar, saturated fats, and salt.  Take vitamin and mineral supplements as recommended by your health care provider.  Do not drink alcohol if your health care provider tells you not to drink.  If you drink alcohol: ? Limit how much you have to 0-1 drink a day. ? Be aware of how much alcohol is in your drink. In the [place] one drink equals one 12 oz bottle of beer (355 mL), one 5 oz glass of wine (148 mL), or one 1 oz glass of hard liquor (44 mL). Lifestyle  Take daily care of your teeth and gums.  Stay active. Exercise for at least 30 minutes on 5 or more days each week.  Do not use any products that contain nicotine or tobacco, such as cigarettes, e-cigarettes, and chewing tobacco. If you need help quitting, ask your health care provider.  If you are sexually active, practice safe sex. Use a condom or other form of protection in order to prevent STIs (sexually transmitted infections).  Talk with your health care provider about taking a low-dose aspirin or statin. What's next?  Go to your health care provider once a year for a well  check visit.  Ask your health care provider how often you should have your eyes and teeth checked.  Stay up to date on all vaccines. This information is not intended to replace advice given to you by your health care provider. Make sure you discuss any questions you have with your health care provider. Document Revised: 10/25/2018 Document Reviewed: 10/25/2018 Elsevier Patient Education  2020 [employer].

## 2019-12-18 NOTE — Assessment & Plan Note (Signed)
Stable.  Continue management per nephrology. 

## 2019-12-18 NOTE — Assessment & Plan Note (Signed)
At goal.  Continue benazepril 40 mg daily, Bystolic 2.5 mg daily, Procardia 30 mg daily.  Check CBC, C met, TSH.

## 2019-12-18 NOTE — Assessment & Plan Note (Signed)
Continue management per gynecology.

## 2019-12-18 NOTE — Assessment & Plan Note (Signed)
Stable.  Refilled lorazepam today.

## 2019-12-18 NOTE — Progress Notes (Signed)
   [name] is a 69 y.o. female who presents today for an office visit.  Assessment/Plan:  Chronic Problems Addressed Today: No problem-specific Assessment & Plan notes found for this encounter.  Preventative Healthcare Check hep C.  Will order For Cologuard.  Up-to-date on vaccines and screenings.  Discussed COVID-19 vaccine.  Check labs including CBC, CMP, TSH, hep C antibody, and A1c.    Subjective:  HPI:  See A/P.         Objective:  Physical Exam: BP 128/74   Pulse 76   Temp (!) 97.4 F (36.3 C)   Ht 5\' 4"  (1.626 m)   Wt 274 lb 4 oz (124.4 kg)   LMP  (LMP Unknown)   SpO2 96%   BMI 47.07 kg/m   Gen: No acute distress, resting comfortably CV: Regular rate and rhythm with no murmurs appreciated Pulm: Normal work of breathing, clear to auscultation bilaterally with no crackles, wheezes, or rhonchi Neuro: Grossly normal, moves all extremities Psych: Normal affect and thought content      Arthor Gorter M. [name], MD 12/18/2019 11:33 AM

## 2019-12-19 LAB — HEPATITIS C ANTIBODY
Hepatitis C Ab: NONREACTIVE
SIGNAL TO CUT-OFF: 0.04 (ref <1.00)

## 2019-12-19 NOTE — Progress Notes (Signed)
Please inform patient of the following:  Blood work is all stable compared to last year. Would like for her to keep up the good work with her diet and exercise and we can recheck in a year or so.  Also please let patient know I did more investigating regarding COVID19 vaccine and did not see any specific data regarding IGA nephropathy. It looks like there are few ongoing trials for patients with autoimmune disorders who receive the covid 19 vaccine. I think it would be reasonable for to hold off for now until we learn more. Also recommend that she follow up with nephrology as well.  Algis Greenhouse. [name], MD 12/19/2019 1:36 PM

## 2020-03-13 ENCOUNTER — Encounter: Payer: Self-pay | Admitting: Family Medicine

## 2020-04-10 ENCOUNTER — Other Ambulatory Visit: Payer: Self-pay

## 2020-04-10 ENCOUNTER — Ambulatory Visit (INDEPENDENT_AMBULATORY_CARE_PROVIDER_SITE_OTHER): Payer: Medicare Other | Admitting: Family Medicine

## 2020-04-10 ENCOUNTER — Telehealth: Payer: Self-pay | Admitting: Family Medicine

## 2020-04-10 ENCOUNTER — Encounter: Payer: Self-pay | Admitting: Family Medicine

## 2020-04-10 VITALS — BP 190/100 | HR 90 | Temp 98.5°F | Ht 64.0 in | Wt 272.4 lb

## 2020-04-10 DIAGNOSIS — N028 Recurrent and persistent hematuria with other morphologic changes: Secondary | ICD-10-CM

## 2020-04-10 DIAGNOSIS — I129 Hypertensive chronic kidney disease with stage 1 through stage 4 chronic kidney disease, or unspecified chronic kidney disease: Secondary | ICD-10-CM

## 2020-04-10 DIAGNOSIS — M5432 Sciatica, left side: Secondary | ICD-10-CM | POA: Diagnosis not present

## 2020-04-10 MED ORDER — PREDNISONE 10 MG PO TABS: ORAL_TABLET | ORAL | 0 refills | Status: DC

## 2020-04-10 MED ORDER — CYCLOBENZAPRINE HCL 10 MG PO TABS: 10.0000 mg | ORAL_TABLET | Freq: Every evening | ORAL | 0 refills | Status: DC | PRN

## 2020-04-10 MED ORDER — TRAMADOL HCL 50 MG PO TABS: 50.0000 mg | ORAL_TABLET | Freq: Three times a day (TID) | ORAL | 0 refills | Status: AC | PRN

## 2020-04-10 NOTE — Telephone Encounter (Signed)
Patient was already scheduled today.

## 2020-04-10 NOTE — Patient Instructions (Signed)
Please follow up if symptoms do not improve or as needed.   Take the medications as prescribed. Please let us know if you are not improving by early next week.    Sciatica  Sciatica is pain, numbness, weakness, or tingling along the path of the sciatic nerve. The sciatic nerve starts in the lower back and runs down the back of each leg. The nerve controls the muscles in the lower leg and in the back of the knee. It also provides feeling (sensation) to the back of the thigh, the lower leg, and the sole of the foot. Sciatica is a symptom of another medical condition that pinches or puts pressure on the sciatic nerve. Sciatica most often only affects one side of the body. Sciatica usually goes away on its own or with treatment. In some cases, sciatica may come back (recur). What are the causes? This condition is caused by pressure on the sciatic nerve or pinching of the nerve. This may be the result of:  A disk in between the bones of the spine bulging out too far (herniated disk).  Age-related changes in the spinal disks.  A pain disorder that affects a muscle in the buttock.  Extra bone growth near the sciatic nerve.  A break (fracture) of the pelvis.  Pregnancy.  Tumor. This is rare. What increases the risk? The following factors may make you more likely to develop this condition:  Playing sports that place pressure or stress on the spine.  Having poor strength and flexibility.  A history of back injury or surgery.  Sitting for long periods of time.  Doing activities that involve repetitive bending or lifting.  Obesity. What are the signs or symptoms? Symptoms can vary from mild to very severe, and they may include:  Any of these problems in the lower back, leg, hip, or buttock: ? Mild tingling, numbness, or dull aches. ? Burning sensations. ? Sharp pains.  Numbness in the back of the calf or the sole of the foot.  Leg weakness.  Severe back pain that makes movement  difficult. Symptoms may get worse when you cough, sneeze, or laugh, or when you sit or stand for long periods of time. How is this diagnosed? This condition may be diagnosed based on:  Your symptoms and medical history.  A physical exam.  Blood tests.  Imaging tests, such as: ? X-rays. ? MRI. ? CT scan. How is this treated? In many cases, this condition improves on its own without treatment. However, treatment may include:  Reducing or modifying physical activity.  Exercising and stretching.  Icing and applying heat to the affected area.  Medicines that help to: ? Relieve pain and swelling. ? Relax your muscles.  Injections of medicines that help to relieve pain, irritation, and inflammation around the sciatic nerve (steroids).  Surgery. Follow these instructions at home: Medicines  Take over-the-counter and prescription medicines only as told by your health care provider.  Ask your health care provider if the medicine prescribed to you: ? Requires you to avoid driving or using heavy machinery. ? Can cause constipation. You may need to take these actions to prevent or treat constipation:  Drink enough fluid to keep your urine pale yellow.  Take over-the-counter or prescription medicines.  Eat foods that are high in fiber, such as beans, whole grains, and fresh fruits and vegetables.  Limit foods that are high in fat and processed sugars, such as fried or sweet foods. Managing pain  If directed, put ice on the affected area. ? Put ice in a plastic bag. ? Place a towel between your skin and the bag. ? Leave the ice on for 20 minutes, 2-3 times a day.  If directed, apply heat to the affected area. Use the heat source that your health care provider recommends, such as a moist heat pack or a heating pad. ? Place a towel between your skin and the heat source. ? Leave the heat on for 20-30 minutes. ? Remove the heat if your skin turns bright red. This is  especially important if you are unable to feel pain, heat, or cold. You may have a greater risk of getting burned. Activity   Return to your normal activities as told by your health care provider. Ask your health care provider what activities are safe for you.  Avoid activities that make your symptoms worse.  Take brief periods of rest throughout the day. ? When you rest for longer periods, mix in some mild activity or stretching between periods of rest. This will help to prevent stiffness and pain. ? Avoid sitting for long periods of time without moving. Get up and move around at least one time each hour.  Exercise and stretch regularly, as told by your health care provider.  Do not lift anything that is heavier than 10 lb (4.5 kg) while you have symptoms of sciatica. When you do not have symptoms, you should still avoid heavy lifting, especially repetitive heavy lifting.  When you lift objects, always use proper lifting technique, which includes: ? Bending your knees. ? Keeping the load close to your body. ? Avoiding twisting. General instructions  Maintain a healthy weight. Excess weight puts extra stress on your back.  Wear supportive, comfortable shoes. Avoid wearing high heels.  Avoid sleeping on a mattress that is too soft or too hard. A mattress that is firm enough to support your back when you sleep may help to reduce your pain.  Keep all follow-up visits as told by your health care provider. This is important. Contact a health care provider if:  You have pain that: ? Wakes you up when you are sleeping. ? Gets worse when you lie down. ? Is worse than you have experienced in the past. ? Lasts longer than 4 weeks.  You have an unexplained weight loss. Get help right away if:  You are not able to control when you urinate or have bowel movements (incontinence).  You have: ? Weakness in your lower back, pelvis, buttocks, or legs that gets worse. ? Redness or swelling  of your back. ? A burning sensation when you urinate. Summary  Sciatica is pain, numbness, weakness, or tingling along the path of the sciatic nerve.  This condition is caused by pressure on the sciatic nerve or pinching of the nerve.  Sciatica can cause pain, numbness, or tingling in the lower back, legs, hips, and buttocks.  Treatment often includes rest, exercise, medicines, and applying ice or heat. This information is not intended to replace advice given to you by your health care provider. Make sure you discuss any questions you have with your health care provider. Document Revised: 11/19/2018 Document Reviewed: 11/19/2018 Elsevier Patient Education  [place].

## 2020-04-10 NOTE — Telephone Encounter (Signed)
  Patient Name: [name] Gender: Female DOB: 10/10/1951 Age: 69 Y 24 M 27 D Return Phone Number: [phone] (Primary), [phone] (Secondary) Address: City/State/Zip: [place] [address] Client [facility] Client Site [facility] [place] Night Physician Dimas Chyle- MD Contact Type Call Who Is Calling Patient / Member / Family / Caregiver Call Type Triage / Clinical Relationship To Patient Self Return Phone Number [phone] (Primary) Chief Complaint Leg Pain Reason for Call Symptomatic / Request for Health Information Initial Comment Caller's sciatica is flaring up and wants to be seen by Dr. [name]. Translation No Nurse Assessment Nurse: Sherrell Puller, RN, Amy Date/Time Eilene Ghazi Time): 04/10/2020 7:44:19 AM Confirm and document reason for call. If symptomatic, describe symptoms. ---Caller states her sciatica is flaring up. Says she's having trouble sleeping due to the pain, says she eventually has to get in a recliner. Lower back and hip pain, going down into leg and toes. Sometimes toes feel numb and a certain area on the upper thigh but says it never gets to the point where her entire leg is numb.  Has the patient had close contact with a person known or suspected to have the novel coronavirus illness OR traveled / lives in area with major community spread (including international travel) in the last 14 days from the onset of symptoms? * If Asymptomatic, screen for exposure and travel within the last 14 days. ---No Does the patient have any new or worsening symptoms? ---Yes Will a triage be completed? ---Yes Related visit to physician within the last 2 weeks? ---No Does the PT have any chronic conditions? (i.e. diabetes, asthma, this includes High risk factors for pregnancy, etc.) ---Yes List chronic conditions. ---Sciatica, kidney disease Is this a behavioral health or substance abuse call?  ---No Guidelines Guideline Title Affirmed Question Affirmed Notes Nurse Date/Time (Eastern Time) Back Pain [1] SEVERE back pain (e.g., excruciating, unable to do any normal activities) AND [2] not Sherrell Puller, RN, Amy 04/10/2020 7:46:43 AM   Guidelines Guideline Title Affirmed Question Affirmed Notes Nurse Date/Time Eilene Ghazi Time) improved 2 hours after pain medicine Disp. Time Eilene Ghazi Time) Disposition Final User 04/10/2020 7:51:51 AM See HCP within 4 Hours (or PCP triage) Yes Sherrell Puller, RN, Amy Caller Disagree/Comply Comply Caller Understands Yes PreDisposition Call Doctor Care Advice Given Per Guideline SEE HCP WITHIN 4 HOURS (OR PCP TRIAGE): * IF OFFICE WILL BE OPEN: You need to be seen within the next 3 or 4 hours. Call your doctor (or NP/PA) now or as soon as the office opens. PAIN MEDICINES: * For pain relief, you can take either acetaminophen, ibuprofen, or naproxen. CALL BACK IF: * You become worse. CARE ADVICE given per Back Pain (Adult) guideline. Referrals REFERRED TO PCP OFFIC

## 2020-04-10 NOTE — Progress Notes (Signed)
Subjective  CC:  Chief Complaint  Patient presents with  . Sciatica    pt c/o left sided sciatica pain x 2 weeks. Pt has taken Tylenol and [place] pas patch, heating pain and ice pack   Same day acute visit; PCP not available. New pt to me. Chart reviewed.   HPI: [name] is a 69 y.o. female who presents to the office today to address the problems listed above in the chief complaint.  Very pleasant 69 yo w/o h/o back problems presents due to 2 week progressive worsening left buttock and low back pain: pain is moderate and interfering with sleep. Hard time getting comfortable or rolling over in bed. Mild pain over buttock and intermittent numbness in toes but no shooting pain down leg or weakness. No bowel or bladder dysfunction. No injury. Using tylenol and salon patches with minimal relief. New problem.   HTN and igA neprhopathy - I reviewed notes from renal from march visit. Add CCB for elevated bps. Renal function is stable w/ nl creat and minimally depressed gfr. No cp, sob, or edema.    Assessment  1. Sciatica of left side   2. IgAN (IgA nephropathy)   3. Hypertension, renal disease      Plan   sciatica:  Vs HNP; most c/w sciatica. Start pred taper, tramadol for pain (avoid nsaids) and mm relaxer if needed at night. F/u if not improving over next several days no red flag sxs. Discussed reasons to seek emergent care.   HTN: with pain response. Will treat pain and pt will check bp at home. If remains this elevated she will contact her renal MD who is managing bp. Discussed htn urgency numbers. Patient understands and agrees with care plan.    Follow up: prn  Visit date not found  No orders of the defined types were placed in this encounter.  Meds ordered this encounter  Medications  . predniSONE (DELTASONE) 10 MG tablet    Sig: Take 4 tabs qd x 2 days, 3 qd x 2 days, 2 qd x 2d, 1qd x 3 days    Dispense:  21 tablet    Refill:  0  . traMADol (ULTRAM) 50 MG tablet   Sig: Take 1 tablet (50 mg total) by mouth every 8 (eight) hours as needed for up to 5 days.    Dispense:  20 tablet    Refill:  0  . cyclobenzaprine (FLEXERIL) 10 MG tablet    Sig: Take 1 tablet (10 mg total) by mouth at bedtime as needed for muscle spasms.    Dispense:  30 tablet    Refill:  0      I reviewed the patients updated PMH, FH, and SocHx.    Patient Active Problem List   Diagnosis Date Noted  . Fibroid 12/18/2019  . Anxiety 05/24/2018  . Myopathy 05/24/2018  . Hepatitis B   . Heart murmur   . IgAN (IgA nephropathy)   . GERD (gastroesophageal reflux disease)   . Arthritis   . Hypertension 01/04/2013  . History of rheumatic fever 01/04/2013   Current Meds  Medication Sig  . b complex vitamins tablet Take 1 tablet by mouth daily.  . benazepril (LOTENSIN) 40 MG tablet Take 40 mg by mouth daily.  . Cholecalciferol (VITAMIN D3 PO) Take 5,000 Units by mouth.  . Coenzyme Q10 (COQ-10) 200 MG CAPS Take 1 capsule by mouth daily.  Marland Kitchen diltiazem (CARDIZEM SR) 120 MG 12 hr capsule Take 120 mg  by mouth 2 (two) times daily.  Marland Kitchen LORazepam (ATIVAN) 0.5 MG tablet TAKE 1 TABLET BY MOUTH EVERY 8 TO 12 HOURS AS NEEDED  . losartan (COZAAR) 100 MG tablet Take 100 mg by mouth daily.  . Magnesium 400 MG CAPS Take 400 mg by mouth 2 (two) times daily.   Marland Kitchen omega-3 acid ethyl esters (LOVAZA) 1 G capsule Take 6,000 mg by mouth in the morning and at bedtime.   Marland Kitchen OVER THE COUNTER MEDICATION Take 1,000 mg by mouth in the morning and at bedtime. Tumeric   . vitamin E 400 UNIT capsule Take 400 Units by mouth daily.    Allergies: Patient is allergic to clonidine derivatives; flagyl [metronidazole]; hydralazine; statins; and sulfa antibiotics. Family History: Patient family history includes COPD in her mother; Cancer in her paternal grandfather; Coronary artery disease in an other family member; Heart Problems in her brother; Hyperlipidemia in her brother; Hypertension in her brother; Kidney disease in  her father; Other in her maternal grandmother and paternal grandmother; Stroke in her maternal grandfather. Social History:  Patient  reports that she has never smoked. She has never used smokeless tobacco. She reports current alcohol use. She reports that she does not use drugs.  Review of Systems: Constitutional: Negative for fever malaise or anorexia Cardiovascular: negative for chest pain Respiratory: negative for SOB or persistent cough Gastrointestinal: negative for abdominal pain  Objective  Vitals: BP (!) 190/100 (BP Location: Left Arm, Patient Position: Sitting, Cuff Size: Large)   Pulse 90   Temp 98.5 F (36.9 C) (Temporal)   Ht 5\' 4"  (1.626 m)   Wt 272 lb 6.1 oz (123.6 kg)   LMP  (LMP Unknown)   SpO2 98%   BMI 46.75 kg/m  General: appears uncomfortable, A&Ox3 Back: left sciatic notch ttp with gluteal mm ttp and spasm. No spinal ttp. FROM and neg seated SLR bilaterally.  Cardiovascular:  RRR without murmur or gallop.  Respiratory:  Good breath sounds bilaterally, CTAB with normal respiratory effort Skin:  Warm, no rashes     Commons side effects, risks, benefits, and alternatives for medications and treatment plan prescribed today were discussed, and the patient expressed understanding of the given instructions. Patient is instructed to call or message via MyChart if he/she has any questions or concerns regarding our treatment plan. No barriers to understanding were identified. We discussed Red Flag symptoms and signs in detail. Patient expressed understanding regarding what to do in case of urgent or emergency type symptoms.   Medication list was reconciled, printed and provided to the patient in AVS. Patient instructions and summary information was reviewed with the patient as documented in the AVS. This note was prepared with assistance of Dragon voice recognition software. Occasional wrong-word or sound-a-like substitutions may have occurred due to the inherent  limitations of voice recognition software  This visit occurred during the SARS-CoV-2 public health emergency.  Safety protocols were in place, including screening questions prior to the visit, additional usage of staff PPE, and extensive cleaning of exam room while observing appropriate contact time as indicated for disinfecting solutions.

## 2020-04-10 NOTE — Telephone Encounter (Signed)
Please schedule patient an appointment to see a provider in office. Thanks

## 2020-08-17 ENCOUNTER — Other Ambulatory Visit: Payer: Self-pay | Admitting: Family Medicine

## 2020-08-18 NOTE — Telephone Encounter (Signed)
Pt notified, will call back to schedule appointment

## 2020-08-18 NOTE — Telephone Encounter (Signed)
Please ask patient to schedule visit soon.

## 2020-11-17 ENCOUNTER — Encounter: Payer: Self-pay | Admitting: Family Medicine

## 2020-11-17 ENCOUNTER — Telehealth: Payer: Self-pay | Admitting: Family Medicine

## 2020-11-17 NOTE — Telephone Encounter (Signed)
Left message for patient to call back and schedule Medicare Annual Wellness Visit (AWV) either virtually OR in office.   Last AWV 10/14/19; please schedule at anytime with [facility]-Nurse Health Advisor at [facility].  This should be a 45 minute visit.   

## 2020-11-22 ENCOUNTER — Encounter: Payer: Self-pay | Admitting: Family Medicine

## 2020-11-26 ENCOUNTER — Other Ambulatory Visit: Payer: Self-pay | Admitting: Family Medicine

## 2020-11-26 ENCOUNTER — Ambulatory Visit (INDEPENDENT_AMBULATORY_CARE_PROVIDER_SITE_OTHER): Payer: Medicare Other

## 2020-11-26 ENCOUNTER — Other Ambulatory Visit: Payer: Self-pay

## 2020-11-26 DIAGNOSIS — Z1211 Encounter for screening for malignant neoplasm of colon: Secondary | ICD-10-CM

## 2020-11-26 DIAGNOSIS — Z0000 Encounter for general adult medical examination without abnormal findings: Secondary | ICD-10-CM | POA: Diagnosis not present

## 2020-11-26 NOTE — Telephone Encounter (Signed)
LR: 08-18-2020 Qty: 30 with 0 refills Last office visit: 04-10-2020 ( acute visit with Dr. And), 12-18-2019 Upcoming appointment: 12-02-2021

## 2020-11-26 NOTE — Patient Instructions (Addendum)
[name] , Thank you for taking time to come for your Medicare Wellness Visit. I appreciate your ongoing commitment to your health goals. Please review the following plan we discussed and let me know if I can assist you in the future.   Screening recommendations/referrals: Colonoscopy: cologuard ordered today  Mammogram: Declined  Bone Density: Done 08/10/18 Recommended yearly ophthalmology/optometry visit for glaucoma screening and checkup Recommended yearly dental visit for hygiene and checkup  Vaccinations: Influenza vaccine: Done 08/02/20  Up to date Pneumococcal vaccine: Declined and discussed Tdap vaccine: declined and discussed Shingles vaccine: Shingrix discussed. Please contact your pharmacy for coverage information.    Covid-19:Completed 5/17, 6/14, & 11/09/20  Advanced directives: Copies in chart  Conditions/risks identified: lose weight  Next appointment: Follow up in one year for your annual wellness visit    Preventive Care 65 Years and Older, Female Preventive care refers to lifestyle choices and visits with your health care provider that can promote health and wellness. What does preventive care include?  A yearly physical exam. This is also called an annual well check.  Dental exams once or twice a year.  Routine eye exams. Ask your health care provider how often you should have your eyes checked.  Personal lifestyle choices, including:  Daily care of your teeth and gums.  Regular physical activity.  Eating a healthy diet.  Avoiding tobacco and drug use.  Limiting alcohol use.  Practicing safe sex.  Taking low-dose aspirin every day.  Taking vitamin and mineral supplements as recommended by your health care provider. What happens during an annual well check? The services and screenings done by your health care provider during your annual well check will depend on your age, overall health, lifestyle risk factors, and family history of  disease. Counseling  Your health care provider may ask you questions about your:  Alcohol use.  Tobacco use.  Drug use.  Emotional well-being.  Home and relationship well-being.  Sexual activity.  Eating habits.  History of falls.  Memory and ability to understand (cognition).  Work and work Statistician.  Reproductive health. Screening  You may have the following tests or measurements:  Height, weight, and BMI.  Blood pressure.  Lipid and cholesterol levels. These may be checked every 5 years, or more frequently if you are over 62 years old.  Skin check.  Lung cancer screening. You may have this screening every year starting at age 44 if you have a 30-pack-year history of smoking and currently smoke or have quit within the past 15 years.  Fecal occult blood test (FOBT) of the stool. You may have this test every year starting at age 52.  Flexible sigmoidoscopy or colonoscopy. You may have a sigmoidoscopy every 5 years or a colonoscopy every 10 years starting at age 75.  Hepatitis C blood test.  Hepatitis B blood test.  Sexually transmitted disease (STD) testing.  Diabetes screening. This is done by checking your blood sugar (glucose) after you have not eaten for a while (fasting). You may have this done every 1-3 years.  Bone density scan. This is done to screen for osteoporosis. You may have this done starting at age 33.  Mammogram. This may be done every 1-2 years. Talk to your health care provider about how often you should have regular mammograms. Talk with your health care provider about your test results, treatment options, and if necessary, the need for more tests. Vaccines  Your health care provider may recommend certain vaccines, such as:  Influenza  vaccine. This is recommended every year.  Tetanus, diphtheria, and acellular pertussis (Tdap, Td) vaccine. You may need a Td booster every 10 years.  Zoster vaccine. You may need this after age  23.  Pneumococcal 13-valent conjugate (PCV13) vaccine. One dose is recommended after age 1.  Pneumococcal polysaccharide (PPSV23) vaccine. One dose is recommended after age 74. Talk to your health care provider about which screenings and vaccines you need and how often you need them. This information is not intended to replace advice given to you by your health care provider. Make sure you discuss any questions you have with your health care provider. Document Released: 11/27/2015 Document Revised: 07/20/2016 Document Reviewed: 09/01/2015 Elsevier Interactive Patient Education  2017 [place] Prevention in the Home Falls can cause injuries. They can happen to people of all ages. There are many things you can do to make your home safe and to help prevent falls. What can I do on the outside of my home?  Regularly fix the edges of walkways and driveways and fix any cracks.  Remove anything that might make you trip as you walk through a door, such as a raised step or threshold.  Trim any bushes or trees on the path to your home.  Use bright outdoor lighting.  Clear any walking paths of anything that might make someone trip, such as rocks or tools.  Regularly check to see if handrails are loose or broken. Make sure that both sides of any steps have handrails.  Any raised decks and porches should have guardrails on the edges.  Have any leaves, snow, or ice cleared regularly.  Use sand or salt on walking paths during winter.  Clean up any spills in your garage right away. This includes oil or grease spills. What can I do in the bathroom?  Use night lights.  Install grab bars by the toilet and in the tub and shower. Do not use towel bars as grab bars.  Use non-skid mats or decals in the tub or shower.  If you need to sit down in the shower, use a plastic, non-slip stool.  Keep the floor dry. Clean up any water that spills on the floor as soon as it happens.  Remove  soap buildup in the tub or shower regularly.  Attach bath mats securely with double-sided non-slip rug tape.  Do not have throw rugs and other things on the floor that can make you trip. What can I do in the bedroom?  Use night lights.  Make sure that you have a light by your bed that is easy to reach.  Do not use any sheets or blankets that are too big for your bed. They should not hang down onto the floor.  Have a firm chair that has side arms. You can use this for support while you get dressed.  Do not have throw rugs and other things on the floor that can make you trip. What can I do in the kitchen?  Clean up any spills right away.  Avoid walking on wet floors.  Keep items that you use a lot in easy-to-reach places.  If you need to reach something above you, use a strong step stool that has a grab bar.  Keep electrical cords out of the way.  Do not use floor polish or wax that makes floors slippery. If you must use wax, use non-skid floor wax.  Do not have throw rugs and other things on the floor that can  make you trip. What can I do with my stairs?  Do not leave any items on the stairs.  Make sure that there are handrails on both sides of the stairs and use them. Fix handrails that are broken or loose. Make sure that handrails are as long as the stairways.  Check any carpeting to make sure that it is firmly attached to the stairs. Fix any carpet that is loose or worn.  Avoid having throw rugs at the top or bottom of the stairs. If you do have throw rugs, attach them to the floor with carpet tape.  Make sure that you have a light switch at the top of the stairs and the bottom of the stairs. If you do not have them, ask someone to add them for you. What else can I do to help prevent falls?  Wear shoes that:  Do not have high heels.  Have rubber bottoms.  Are comfortable and fit you well.  Are closed at the toe. Do not wear sandals.  If you use a  stepladder:  Make sure that it is fully opened. Do not climb a closed stepladder.  Make sure that both sides of the stepladder are locked into place.  Ask someone to hold it for you, if possible.  Clearly mark and make sure that you can see:  Any grab bars or handrails.  First and last steps.  Where the edge of each step is.  Use tools that help you move around (mobility aids) if they are needed. These include:  Canes.  Walkers.  Scooters.  Crutches.  Turn on the lights when you go into a dark area. Replace any light bulbs as soon as they burn out.  Set up your furniture so you have a clear path. Avoid moving your furniture around.  If any of your floors are uneven, fix them.  If there are any pets around you, be aware of where they are.  Review your medicines with your doctor. Some medicines can make you feel dizzy. This can increase your chance of falling. Ask your doctor what other things that you can do to help prevent falls. This information is not intended to replace advice given to you by your health care provider. Make sure you discuss any questions you have with your health care provider. Document Released: 08/27/2009 Document Revised: 04/07/2016 Document Reviewed: 12/05/2014 Elsevier Interactive Patient Education  2017 [employer].

## 2020-11-26 NOTE — Progress Notes (Signed)
Virtual Visit via Telephone Note  I connected with  [name] on 11/26/20 at  2:30 PM EST by telephone and verified that I am speaking with the correct person using two identifiers.  Medicare Annual Wellness visit completed telephonically due to Covid-19 pandemic.   Persons participating in this call: This Health Coach and this patient.   Location: Patient: Home Provider: Office   I discussed the limitations, risks, security and privacy concerns of performing an evaluation and management service by telephone and the availability of in person appointments. The patient expressed understanding and agreed to proceed.  Unable to perform video visit due to video visit attempted and failed and/or patient does not have video capability.   Some vital signs may be absent or patient reported.   Willette Brace, LPN    Subjective:   [name] is a 70 y.o. female who presents for Medicare Annual (Subsequent) preventive examination.  Review of Systems     Cardiac Risk Factors include: advanced age (>52men, >76 women);hypertension     Objective:    There were no vitals filed for this visit. There is no height or weight on file to calculate BMI.  Advanced Directives 11/26/2020 10/14/2019 01/21/2015 01/04/2013  Does Patient Have a Medical Advance Directive? Yes No No Patient has advance directive, copy not in chart  Type of Advance Directive [place] will  [place] in Chart? Yes - validated most recent copy scanned in chart (See row information) - - Copy requested from family  Would patient like information on creating a medical advance directive? - Yes (MAU/Ambulatory/Procedural Areas - Information given) No - patient declined information -    Current Medications (verified) Outpatient Encounter Medications as of 11/26/2020  Medication Sig  . b complex vitamins tablet Take 1 tablet by mouth daily.  . benazepril  (LOTENSIN) 40 MG tablet Take 40 mg by mouth daily.  . Cholecalciferol (VITAMIN D3 PO) Take 5,000 Units by mouth.  . Coenzyme Q10 (COQ-10) 200 MG CAPS Take 1 capsule by mouth daily.  Marland Kitchen DILTIAZEM HCL ER PO Take by mouth 2 (two) times daily. 90 mg  . losartan (COZAAR) 50 MG tablet Take 50 mg by mouth daily.  . nebivolol (BYSTOLIC) 5 MG tablet   . OVER THE COUNTER MEDICATION Take 1,000 mg by mouth in the morning and at bedtime. Tumeric  . vitamin E 400 UNIT capsule Take 400 Units by mouth daily.  Marland Kitchen LORazepam (ATIVAN) 0.5 MG tablet TAKE ONE TABLET BY MOUTH EVERY 8 TO 12 HOURS AS NEEDED (Patient not taking: Reported on 11/26/2020)  . [DISCONTINUED] cyclobenzaprine (FLEXERIL) 10 MG tablet Take 1 tablet (10 mg total) by mouth at bedtime as needed for muscle spasms.  . [DISCONTINUED] diltiazem (CARDIZEM SR) 120 MG 12 hr capsule Take 120 mg by mouth 2 (two) times daily.  . [DISCONTINUED] losartan (COZAAR) 100 MG tablet Take 100 mg by mouth daily.  . [DISCONTINUED] Magnesium 400 MG CAPS Take 400 mg by mouth 2 (two) times daily.   . [DISCONTINUED] omega-3 acid ethyl esters (LOVAZA) 1 G capsule Take 6,000 mg by mouth in the morning and at bedtime.   . [DISCONTINUED] predniSONE (DELTASONE) 10 MG tablet Take 4 tabs qd x 2 days, 3 qd x 2 days, 2 qd x 2d, 1qd x 3 days   No facility-administered encounter medications on file as of 11/26/2020.    Allergies (verified) Clonidine derivatives, Flagyl [metronidazole], Hydralazine, Statins, and Sulfa antibiotics  History: Past Medical History:  Diagnosis Date  . Anxiety   . Arthritis    "painful joints" (01/21/2015)  . GERD (gastroesophageal reflux disease)   . Headache    "at least q other day lately" (01/21/2015)  . Heart murmur   . Hepatitis B ~ 1992  . History of gout   . Hypertension   . IgAN (IgA nephropathy)   . Migraine X 1  . Rheumatic fever ~ 8185   Past Surgical History:  Procedure Laterality Date  . DILATION AND CURETTAGE OF UTERUS  ~ 1965  .  ERCP  ~ 1993  . LAPAROSCOPIC CHOLECYSTECTOMY  ~ 1983   Family History  Problem Relation Age of Onset  . COPD Mother   . Kidney disease Father   . Heart Problems Brother   . Hypertension Brother   . Hyperlipidemia Brother   . Other Maternal Grandmother        natural causes  . Stroke Maternal Grandfather   . Other Paternal Grandmother        natural causes  . Cancer Paternal Grandfather   . Coronary artery disease Other        Half brother   Social History   Socioeconomic History  . Marital status: Married    Spouse name: Not on file  . Number of children: 0  . Years of education: Not on file  . Highest education level: Not on file  Occupational History  . Occupation: retired  Tobacco Use  . Smoking status: Never Smoker  . Smokeless tobacco: Never Used  Substance and Sexual Activity  . Alcohol use: Yes    Comment: 01/21/2015 "1, 12oz beer/month; if that"  . Drug use: No  . Sexual activity: Yes  Other Topics Concern  . Not on file  Social History Narrative  . Not on file   Social Determinants of Health   Financial Resource Strain: Low Risk   . Difficulty of Paying Living Expenses: Not hard at all  Food Insecurity: No Food Insecurity  . Worried About Charity fundraiser in the Last Year: Never true  . Ran Out of Food in the Last Year: Never true  Transportation Needs: No Transportation Needs  . Lack of Transportation (Medical): No  . Lack of Transportation (Non-Medical): No  Physical Activity: Inactive  . Days of Exercise per Week: 0 days  . Minutes of Exercise per Session: 0 min  Stress: Stress Concern Present  . Feeling of Stress : To some extent  Social Connections: Moderately Isolated  . Frequency of Communication with Friends and Family: More than three times a week  . Frequency of Social Gatherings with Friends and Family: Once a week  . Attends Religious Services: Never  . Active Member of Clubs or Organizations: No  . Attends Archivist  Meetings: Never  . Marital Status: Married    Tobacco Counseling Counseling given: Not Answered   Clinical Intake:  Pre-visit preparation completed: Yes  Pain : No/denies pain     BMI - recorded: 21.54 Nutritional Status: BMI of 19-24  Normal Nutritional Risks: None Diabetes: No  How often do you need to have someone help you when you read instructions, pamphlets, or other written materials from your doctor or pharmacy?: 1 - Never  Diabetic?no  Interpreter Needed?: No  Information entered by :: Charlott Rakes, LPN   Activities of Daily Living In your present state of health, do you have any difficulty performing the following activities: 11/26/2020  Hearing?  N  Vision? N  Difficulty concentrating or making decisions? N  Walking or climbing stairs? Y  Dressing or bathing? N  Doing errands, shopping? N  Preparing Food and eating ? N  Using the Toilet? N  In the past six months, have you accidently leaked urine? N  Do you have problems with loss of bowel control? N  Managing your Medications? N  Managing your Finances? N  Housekeeping or managing your Housekeeping? N  Some recent data might be hidden    Patient Care Team: Vivi Barrack, MD as PCP - General (Family Medicine) Murrell Redden Earlyne Iba, MD as Consulting Physician (Obstetrics and Gynecology) Donato Heinz, MD as Consulting Physician (Nephrology)  Indicate any recent Medical Services you may have received from other than [facility] providers in the past year (date may be approximate).     Assessment:   This is a routine wellness examination for Alesana.  Hearing/Vision screen  Hearing Screening   125Hz  250Hz  500Hz  1000Hz  2000Hz  3000Hz  4000Hz  6000Hz  8000Hz   Right ear:           Left ear:           Comments: Pt denies any hearing issues   Vision Screening Comments: Pt has not followed up last year for eye exams stated she will follow up this year   Dietary issues and exercise activities  discussed: Current Exercise Habits: The patient does not participate in regular exercise at present  Goals    . Patient Stated     Lose weight      Depression Screen PHQ 2/9 Scores 11/26/2020 10/14/2019 07/02/2019 05/24/2018  PHQ - 2 Score 0 0 0 0    Fall Risk Fall Risk  11/26/2020 10/14/2019  Falls in the past year? 0 0  Number falls in past yr: 0 -  Injury with Fall? 0 0  Follow up Falls prevention discussed Falls evaluation completed;Education provided;Falls prevention discussed    FALL RISK PREVENTION PERTAINING TO THE HOME:  Any stairs in or around the home? Yes  If so, are there any without handrails? No  Home free of loose throw rugs in walkways, pet beds, electrical cords, etc? Yes  Adequate lighting in your home to reduce risk of falls? Yes   ASSISTIVE DEVICES UTILIZED TO PREVENT FALLS:  Life alert? No  Use of a cane, walker or w/c? No  Grab bars in the bathroom? Yes  Shower chair or bench in shower? No  Elevated toilet seat or a handicapped toilet? Yes   TIMED UP AND GO:  Was the test performed? No .     Cognitive Function:     6CIT Screen 11/26/2020  What Year? 0 points  What month? 0 points  Count back from 20 0 points  Months in reverse 0 points  Repeat phrase 2 points    Immunizations Immunization History  Administered Date(s) Administered  . Influenza-Unspecified 08/02/2020  . Moderna Sars-Covid-2 Vaccination 03/30/2020, 04/27/2020, 11/09/2020    TDAP status: Due, Education has been provided regarding the importance of this vaccine. Advised may receive this vaccine at local pharmacy or Health Dept. Aware to provide a copy of the vaccination record if obtained from local pharmacy or Health Dept. Verbalized acceptance and understanding.  Flu Vaccine status: Up to date  Done 08/02/20  Pneumococcal vaccine status: Due, Education has been provided regarding the importance of this vaccine. Advised may receive this vaccine at local pharmacy or Health  Dept. Aware to provide a copy of the vaccination record  if obtained from local pharmacy or Health Dept. Verbalized acceptance and understanding.  Covid-19 vaccine status: Completed vaccines  Qualifies for Shingles Vaccine? Yes   Zostavax completed No   Shingrix Completed?: No.    Education has been provided regarding the importance of this vaccine. Patient has been advised to call insurance company to determine out of pocket expense if they have not yet received this vaccine. Advised may also receive vaccine at local pharmacy or Health Dept. Verbalized acceptance and understanding.  Screening Tests Health Maintenance  Topic Date Due  . Fecal DNA (Cologuard)  04/19/2018  . MAMMOGRAM  12/17/2020 (Originally 09/14/2002)  . TETANUS/TDAP  12/17/2020 (Originally 09/15/1971)  . PNA vac Low Risk Adult (1 of 2 - PCV13) 12/17/2020 (Originally 09/14/2017)  . INFLUENZA VACCINE  Completed  . DEXA SCAN  Completed  . COVID-19 Vaccine  Completed  . Hepatitis C Screening  Completed    Health Maintenance  Health Maintenance Due  Topic Date Due  . Fecal DNA (Cologuard)  04/19/2018    Colorectal cancer screening: Referral to GI placed 11/26/20. Pt aware the office will call re: appt.  Mammogram status: Completed pt declined. Repeat every year  Bone Density status: Completed 08/10/18. Results reflect: Bone density results: NORMAL. Repeat every 2 years.  Additional Screening:  Hepatitis C Screening: Completed 12/18/19  Vision Screening: Recommended annual ophthalmology exams for early detection of glaucoma and other disorders of the eye. Is the patient up to date with their annual eye exam?  No  Who is the provider or what is the name of the office in which the patient attends annual eye exams? Pt stated she will follow up this year, importance discussed   Dental Screening: Recommended annual dental exams for proper oral hygiene  Community Resource Referral / Chronic Care Management: CRR required  this visit?  No   CCM required this visit?  No      Plan:     I have personally reviewed and noted the following in the patient's chart:   . Medical and social history . Use of alcohol, tobacco or illicit drugs  . Current medications and supplements . Functional ability and status . Nutritional status . Physical activity . Advanced directives . List of other physicians . Hospitalizations, surgeries, and ER visits in previous 12 months . Vitals . Screenings to include cognitive, depression, and falls . Referrals and appointments  In addition, I have reviewed and discussed with patient certain preventive protocols, quality metrics, and best practice recommendations. A written personalized care plan for preventive services as well as general preventive health recommendations were provided to patient.     Willette Brace, LPN   [id number]   Nurse Notes: None

## 2020-11-27 MED ORDER — LORAZEPAM 0.5 MG PO TABS: ORAL_TABLET | ORAL | 0 refills | Status: DC

## 2020-11-27 NOTE — Telephone Encounter (Signed)
Please ask her to schedule yearly visit soon.

## 2020-12-08 ENCOUNTER — Encounter: Payer: Self-pay | Admitting: Family Medicine

## 2021-02-18 ENCOUNTER — Encounter: Payer: Self-pay | Admitting: Family Medicine

## 2021-02-18 NOTE — Telephone Encounter (Signed)
Please advise 

## 2021-02-19 ENCOUNTER — Encounter: Payer: Self-pay | Admitting: Family Medicine

## 2021-07-28 LAB — CBC AND DIFFERENTIAL
HCT: 47 — AB (ref 36–46)
Hemoglobin: 15.9 (ref 12.0–16.0)
Neutrophils Absolute: 6.5
Platelets: 255 (ref 150–399)
WBC: 10.7

## 2021-07-28 LAB — CBC: RBC: 5.24 — AB (ref 3.87–5.11)

## 2021-07-28 LAB — BASIC METABOLIC PANEL
BUN: 12 (ref 4–21)
CO2: 27 — AB (ref 13–22)
Chloride: 102 (ref 99–108)
Creatinine: 0.6 (ref 0.5–1.1)
Glucose: 93
Potassium: 4.3 (ref 3.4–5.3)
Sodium: 138 (ref 137–147)

## 2021-07-28 LAB — COMPREHENSIVE METABOLIC PANEL
Albumin: 4.3 (ref 3.5–5.0)
Calcium: 10.1 (ref 8.7–10.7)
GFR calc non Af Amer: 98

## 2021-08-13 ENCOUNTER — Encounter: Payer: Self-pay | Admitting: Family Medicine

## 2021-11-22 ENCOUNTER — Telehealth: Payer: Self-pay | Admitting: Family Medicine

## 2021-11-22 LAB — CBC AND DIFFERENTIAL
HCT: 45 (ref 36–46)
Hemoglobin: 15.4 (ref 12.0–16.0)
Neutrophils Absolute: 5.2
Platelets: 273 (ref 150–399)
WBC: 5

## 2021-11-22 LAB — COMPREHENSIVE METABOLIC PANEL
Albumin: 4.2 (ref 3.5–5.0)
Calcium: 10.2 (ref 8.7–10.7)

## 2021-11-22 LAB — BASIC METABOLIC PANEL
BUN: 12 (ref 4–21)
CO2: 25 — AB (ref 13–22)
Chloride: 102 (ref 99–108)
Creatinine: 0.6 (ref 0.5–1.1)
Glucose: 96
Potassium: 4.4 (ref 3.4–5.3)
Sodium: 139 (ref 137–147)

## 2021-11-22 LAB — TSH: TSH: 1.22 (ref 0.41–5.90)

## 2021-11-22 LAB — CBC: RBC: 4.99 (ref 3.87–5.11)

## 2021-11-22 NOTE — Telephone Encounter (Signed)
Copied from [place] [id number]. Topic: Medicare AWV >> Nov 22, 2021  1:22 PM Harris-Coley, Hannah Beat wrote: Reason for CRM: Left message 11/22/2021 for patient to re-schedule Annual Wellness Visit.  Please schedule with Nurse Health Advisor Charlott Rakes, RN at [facility].  Please call [phone] ask for [facility]

## 2021-11-29 ENCOUNTER — Ambulatory Visit (INDEPENDENT_AMBULATORY_CARE_PROVIDER_SITE_OTHER): Payer: Medicare Other | Admitting: Sports Medicine

## 2021-11-29 ENCOUNTER — Other Ambulatory Visit: Payer: Self-pay | Admitting: Sports Medicine

## 2021-11-29 ENCOUNTER — Other Ambulatory Visit: Payer: Self-pay

## 2021-11-29 ENCOUNTER — Ambulatory Visit (INDEPENDENT_AMBULATORY_CARE_PROVIDER_SITE_OTHER): Payer: Medicare Other

## 2021-11-29 VITALS — BP 140/90 | HR 71 | Ht 64.0 in

## 2021-11-29 DIAGNOSIS — M2352 Chronic instability of knee, left knee: Secondary | ICD-10-CM | POA: Diagnosis not present

## 2021-11-29 DIAGNOSIS — M25562 Pain in left knee: Secondary | ICD-10-CM

## 2021-11-29 DIAGNOSIS — M1712 Unilateral primary osteoarthritis, left knee: Secondary | ICD-10-CM | POA: Diagnosis not present

## 2021-11-29 NOTE — Progress Notes (Signed)
[name] D.[name] Phone: [phone]   Assessment and Plan:     1. Left knee pain, unspecified chronicity 2. Primary osteoarthritis of left knee 3. Chronic instability of left knee -Chronic with exacerbation, initial sports medicine visit - Fall onto left knee 4 months ago with acute worsening over the past 2 weeks with multiple episodes of instability - Due to episodes of instability, positive physical exam findings, x-ray positive for osteoarthritic changes, but negative for acute fracture dislocation, failure to improve with >6 weeks of rest, Tylenol, I believe that we will investigate further with MRI of left knee.  I am concerned for ACL tear versus large meniscal tear based on physical exam finding and office - X-ray obtained in clinic.  My interpretation: Severe lateral compartment osteoarthritis with cortical changes, decreased joint space, cystic changes, moderate medial compartment and patellofemoral arthritic changes.  No acute fracture or dislocation - Recommend relative rest and using 1 pronged cane for support to prevent falls if she has an additional episode of instability   Pertinent previous records reviewed include none   Follow Up: After MRI to review results and discuss treatment plan   Subjective:   I, [name], am serving as a Education administrator for [name]  Chief Complaint: left knee pain and weakness   HPI:   11/29/21 Patient is a 71 year old female complaining of left knee pain and weakness. Patient states 4 months ago she fell , noticed two weeks ago her legs started swelling she was unstable was worried that she was going to fall , not stable like it use to be, has been using a tens units 5/6 days has been using  compression sleeve, has been taking tylenol when she's in pain no more than she has to stays in the knee , anterior medial knee pain (sore) feels like it  has gotten better since using her tens unit, when turning if not careful it is painful has been using a walking stick , if she doesn't use walking stick she feels like she might fall  Relevant Historical Information: Hypertension, GERD  Additional pertinent review of systems negative.   Current Outpatient Medications:    b complex vitamins tablet, Take 1 tablet by mouth daily., Disp: , Rfl:    benazepril (LOTENSIN) 40 MG tablet, Take 40 mg by mouth daily., Disp: , Rfl:    Cholecalciferol (VITAMIN D3 PO), Take 5,000 Units by mouth., Disp: , Rfl:    Coenzyme Q10 (COQ-10) 200 MG CAPS, Take 1 capsule by mouth daily., Disp: , Rfl:    DILTIAZEM HCL ER PO, Take by mouth 2 (two) times daily. 90 mg, Disp: , Rfl:    glucosamine-chondroitin 500-400 MG tablet, Take 2 tablets by mouth 3 (three) times daily., Disp: , Rfl:    hydrochlorothiazide (HYDRODIURIL) 12.5 MG tablet, Take 12.5 mg by mouth daily as needed., Disp: , Rfl:    LORazepam (ATIVAN) 0.5 MG tablet, TAKE ONE TABLET BY MOUTH EVERY 8 TO 12 HOURS AS NEEDED, Disp: 30 tablet, Rfl: 0   losartan (COZAAR) 50 MG tablet, Take 50 mg by mouth daily., Disp: , Rfl:    nebivolol (BYSTOLIC) 5 MG tablet, , Disp: , Rfl:    OVER THE COUNTER MEDICATION, Take 1,000 mg by mouth in the morning and at bedtime. Tumeric, Disp: , Rfl:    vitamin E 400 UNIT capsule, Take 400 Units by mouth daily., Disp: , Rfl:  Objective:     Vitals:   11/29/21 1502  BP: 140/90  Pulse: 71  SpO2: 96%  Height: 5\' 4"  (1.626 m)      Body mass index is 46.75 kg/m.    Physical Exam:    General:  awake, alert oriented, no acute distress nontoxic Skin: no suspicious lesions or rashes Neuro:sensation intact, no deficits, strength 5/5 with no deficits, no atrophy, normal muscle tone Psych: No signs of anxiety, depression or other mood disorder  Knee: No swelling No deformity Neg fluid wave, joint milking ROM Flex 100, Ext 10 TTP medial joint line, lateral joint line NTTP  over the quad tendon, medial fem condyle, lat fem condyle, patella, plica, patella tendon, tibial tuberostiy, fibular head, posterior fossa, pes anserine bursa, gerdy's tubercle,   Positive anterior and negative posterior drawer Equivocal lachman with positive lever sign Neg sag sign Negative varus stress Negative valgus stress Positive McMurray with painful palpable pop Too unstable to perform Thessaly  Gait slowed, favoring right leg, using 1 pronged cane   Electronically signed by:  [name] D.[name] 3:41 PM 11/29/21

## 2021-11-29 NOTE — Patient Instructions (Addendum)
Good to see you  Continue to use cane for support  Tylenol as needed  MRI referral 3 days after MRI call for an appointment to discuss results

## 2021-12-02 ENCOUNTER — Ambulatory Visit: Payer: Medicare Other

## 2021-12-03 ENCOUNTER — Encounter: Payer: Self-pay | Admitting: Family Medicine

## 2021-12-10 ENCOUNTER — Ambulatory Visit: Payer: Medicare Other | Admitting: Family Medicine

## 2021-12-10 ENCOUNTER — Ambulatory Visit: Payer: Medicare Other

## 2021-12-22 ENCOUNTER — Other Ambulatory Visit: Payer: Self-pay | Admitting: Family Medicine

## 2022-01-28 ENCOUNTER — Ambulatory Visit: Payer: Medicare Other | Admitting: Family Medicine

## 2022-01-31 ENCOUNTER — Ambulatory Visit (INDEPENDENT_AMBULATORY_CARE_PROVIDER_SITE_OTHER): Payer: Medicare Other | Admitting: Family Medicine

## 2022-01-31 ENCOUNTER — Encounter: Payer: Self-pay | Admitting: Family Medicine

## 2022-01-31 ENCOUNTER — Ambulatory Visit: Payer: Medicare Other | Admitting: Family Medicine

## 2022-01-31 VITALS — BP 194/90 | HR 61 | Temp 98.0°F | Ht 64.0 in | Wt 263.0 lb

## 2022-01-31 DIAGNOSIS — E785 Hyperlipidemia, unspecified: Secondary | ICD-10-CM

## 2022-01-31 DIAGNOSIS — Z1211 Encounter for screening for malignant neoplasm of colon: Secondary | ICD-10-CM

## 2022-01-31 DIAGNOSIS — Z13220 Encounter for screening for lipoid disorders: Secondary | ICD-10-CM | POA: Diagnosis not present

## 2022-01-31 DIAGNOSIS — F419 Anxiety disorder, unspecified: Secondary | ICD-10-CM

## 2022-01-31 DIAGNOSIS — I10 Essential (primary) hypertension: Secondary | ICD-10-CM | POA: Diagnosis not present

## 2022-01-31 DIAGNOSIS — N028 Recurrent and persistent hematuria with other morphologic changes: Secondary | ICD-10-CM

## 2022-01-31 DIAGNOSIS — Z1231 Encounter for screening mammogram for malignant neoplasm of breast: Secondary | ICD-10-CM

## 2022-01-31 DIAGNOSIS — Z1239 Encounter for other screening for malignant neoplasm of breast: Secondary | ICD-10-CM

## 2022-01-31 DIAGNOSIS — Z23 Encounter for immunization: Secondary | ICD-10-CM | POA: Diagnosis not present

## 2022-01-31 LAB — LIPID PANEL
Cholesterol: 202 mg/dL — ABNORMAL HIGH (ref 0–200)
HDL: 57.1 mg/dL (ref >39.00)
LDL Cholesterol: 126 mg/dL — ABNORMAL HIGH (ref 0–99)
NonHDL: 144.84
Total CHOL/HDL Ratio: 4
Triglycerides: 96 mg/dL (ref 0.0–149.0)
VLDL: 19.2 mg/dL (ref 0.0–40.0)

## 2022-01-31 MED ORDER — LORAZEPAM 1 MG PO TABS: 1.0000 mg | ORAL_TABLET | Freq: Three times a day (TID) | ORAL | 3 refills | Status: DC | PRN

## 2022-01-31 NOTE — Assessment & Plan Note (Signed)
Very elevated today.  No signs of endorgan damage.  Readings typically well controlled at home.  We will continue current regimen per nephrology.  She did recently have some labs that showed elevated catecholamines.  We will continue Bystolic 2.5 mg daily, Procardia 30 mg daily, and benazepril 40 mg daily. ?

## 2022-01-31 NOTE — Patient Instructions (Signed)
It was very nice to see you today! ? ?We will increase your dose of Ativan. ? ?Please let me know if you need any further assistance with your knee. ? ?Send a message in a few weeks to let me know how you are doing with the increased dose of the Ativan. ? ?We will check your cholesterol level today. ? ?I will see back in 1 year for your next annual checkup.  Come back sooner if needed. ? ?Take care, ?Dr [name] ? ?PLEASE NOTE: ? ?If you had any lab tests please let us know if you have not heard back within a few days. You may see your results on mychart before we have a chance to review them but we will give you a call once they are reviewed by [place]. If we ordered any referrals today, please let us know if you have not heard from their office within the next week.  ? ?Please try these tips to maintain a healthy lifestyle: ? ?Eat at least 3 REAL meals and 1-2 snacks per day.  Aim for no more than 5 hours between eating.  If you eat breakfast, please do so within one hour of getting up.  ? ?Each meal should contain half fruits/vegetables, one quarter protein, and one quarter carbs (no bigger than a computer mouse) ? ?Cut down on sweet beverages. This includes juice, soda, and sweet tea.  ? ?Drink at least 1 glass of water with each meal and aim for at least 8 glasses per day ? ?Exercise at least 150 minutes every week.   ? ?Preventive Care 47 Years and Older, Female ?Preventive care refers to lifestyle choices and visits with your health care provider that can promote health and wellness. Preventive care visits are also called wellness exams. ?What can I expect for my preventive care visit? ?Counseling ?Your health care provider may ask you questions about your: ?Medical history, including: ?Past medical problems. ?Family medical history. ?Pregnancy and menstrual history. ?History of falls. ?Current health, including: ?Memory and ability to understand (cognition). ?Emotional well-being. ?Home life and relationship  well-being. ?Sexual activity and sexual health. ?Lifestyle, including: ?Alcohol, nicotine or tobacco, and drug use. ?Access to firearms. ?Diet, exercise, and sleep habits. ?Work and work Statistician. ?Sunscreen use. ?Safety issues such as seatbelt and bike helmet use. ?Physical exam ?Your health care provider will check your: ?Height and weight. These may be used to calculate your BMI (body mass index). BMI is a measurement that tells if you are at a healthy weight. ?Waist circumference. This measures the distance around your waistline. This measurement also tells if you are at a healthy weight and may help predict your risk of certain diseases, such as type 2 diabetes and high blood pressure. ?Heart rate and blood pressure. ?Body temperature. ?Skin for abnormal spots. ?What immunizations do I need? ?Vaccines are usually given at various ages, according to a schedule. Your health care provider will recommend vaccines for you based on your age, medical history, and lifestyle or other factors, such as travel or where you work. ?What tests do I need? ?Screening ?Your health care provider may recommend screening tests for certain conditions. This may include: ?Lipid and cholesterol levels. ?Hepatitis C test. ?Hepatitis B test. ?HIV (human immunodeficiency virus) test. ?STI (sexually transmitted infection) testing, if you are at risk. ?Lung cancer screening. ?Colorectal cancer screening. ?Diabetes screening. This is done by checking your blood sugar (glucose) after you have not eaten for a while (fasting). ?Mammogram. Talk with your health  care provider about how often you should have regular mammograms. ?BRCA-related cancer screening. This may be done if you have a family history of breast, ovarian, tubal, or peritoneal cancers. ?Bone density scan. This is done to screen for osteoporosis. ?Talk with your health care provider about your test results, treatment options, and if necessary, the need for more tests. ?Follow  these instructions at home: ?Eating and drinking ? ?Eat a diet that includes fresh fruits and vegetables, whole grains, lean protein, and low-fat dairy products. Limit your intake of foods with high amounts of sugar, saturated fats, and salt. ?Take vitamin and mineral supplements as recommended by your health care provider. ?Do not drink alcohol if your health care provider tells you not to drink. ?If you drink alcohol: ?Limit how much you have to 0-1 drink a day. ?Know how much alcohol is in your drink. In the [place] one drink equals one 12 oz bottle of beer (355 mL), one 5 oz glass of wine (148 mL), or one 1? oz glass of hard liquor (44 mL). ?Lifestyle ?Brush your teeth every morning and night with fluoride toothpaste. Floss one time each day. ?Exercise for at least 30 minutes 5 or more days each week. ?Do not use any products that contain nicotine or tobacco. These products include cigarettes, chewing tobacco, and vaping devices, such as e-cigarettes. If you need help quitting, ask your health care provider. ?Do not use drugs. ?If you are sexually active, practice safe sex. Use a condom or other form of protection in order to prevent STIs. ?Take aspirin only as told by your health care provider. Make sure that you understand how much to take and what form to take. Work with your health care provider to find out whether it is safe and beneficial for you to take aspirin daily. ?Ask your health care provider if you need to take a cholesterol-lowering medicine (statin). ?Find healthy ways to manage stress, such as: ?Meditation, yoga, or listening to music. ?Journaling. ?Talking to a trusted person. ?Spending time with friends and family. ?Minimize exposure to UV radiation to reduce your risk of skin cancer. ?Safety ?Always wear your seat belt while driving or riding in a vehicle. ?Do not drive: ?If you have been drinking alcohol. Do not ride with someone who has been drinking. ?When you are tired or distracted. ?While  texting. ?If you have been using any mind-altering substances or drugs. ?Wear a helmet and other protective equipment during sports activities. ?If you have firearms in your house, make sure you follow all gun safety procedures. ?What's next? ?Visit your health care provider once a year for an annual wellness visit. ?Ask your health care provider how often you should have your eyes and teeth checked. ?Stay up to date on all vaccines. ?This information is not intended to replace advice given to you by your health care provider. Make sure you discuss any questions you have with your health care provider. ?Document Revised: 04/28/2021 Document Reviewed: 04/28/2021 ?Elsevier Patient Education ? [employer]. ? ?

## 2022-01-31 NOTE — Assessment & Plan Note (Signed)
Continue management per nephrology

## 2022-01-31 NOTE — Progress Notes (Signed)
? ?  [name] is a 71 y.o. female who presents today for an office visit. ? ?Assessment/Plan:  ?Chronic Problems Addressed Today: ?Hypertension ?Very elevated today.  No signs of endorgan damage.  Readings typically well controlled at home.  We will continue current regimen per nephrology.  She did recently have some labs that showed elevated catecholamines.  We will continue Bystolic 2.5 mg daily, Procardia 30 mg daily, and benazepril 40 mg daily. ? ?Anxiety ?Not well controlled.  We will increase lorazepam to 1 mg twice daily.  She will follow-up with me in a few weeks on MyChart. ? 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 ? ?Preventative Healthcare ?Pneumonia vaccine given today.  Check lipids.  We will order mammogram.  Also order Cologuard. ? ?  ?Subjective:  ?HPI: ? ?See A/P for status of chronic conditions.  Patient is here for yearly follow-up.  Last seen a couple of years ago.  Since her last visit she has followed up with nephrology multiple times.  She has had issues with labile blood pressures.  Her blood pressure readings typically in the 120s to 130s over 80s at home with some home spike in the 170s. ? ?She has also had issues with increased anxiety due to her history of IgA nephropathy.  She uses Ativan 0.5 mg twice daily as needed though would like to increase the dose.  No reported side effects. ? ?She injured her knee several months ago.  Follow-up with sports medicine.  Symptoms seem to be improving. ? ?   ?  ?Objective:  ?Physical Exam: ?BP (!) 194/90 (BP Location: Right Arm)   Pulse 61   Temp 98 ?F (36.7 ?C) (Temporal)   Ht '5\' 4"'$  (1.626 m)   Wt 263 lb (119.3 kg)   LMP  (LMP Unknown)   SpO2 97%   BMI 45.14 kg/m?   ?Gen: No acute distress, resting comfortably ?CV: Regular rate and rhythm with no murmurs appreciated ?Pulm: Normal work of breathing, clear to auscultation bilaterally with no crackles, wheezes, or rhonchi ?Neuro: Grossly normal, moves all  extremities ?Psych: Normal affect and thought content ? ?   ? ?Algis Greenhouse. [name], MD ?01/31/2022 1:50 PM  ?

## 2022-01-31 NOTE — Assessment & Plan Note (Signed)
Not well controlled.  We will increase lorazepam to 1 mg twice daily.  She will follow-up with me in a few weeks on MyChart. ?

## 2022-02-01 ENCOUNTER — Encounter: Payer: Self-pay | Admitting: Family Medicine

## 2022-02-02 ENCOUNTER — Telehealth: Payer: Self-pay | Admitting: Family Medicine

## 2022-02-02 NOTE — Telephone Encounter (Signed)
Pt states she would like her mammogram done at [facility] in [place], [place]. Phone [phone] ?

## 2022-02-02 NOTE — Progress Notes (Signed)
Please inform patient of the following: ? ?Cholesterol levels better than last year.  Do not need to make any changes.  We can recheck in a year.  She should continue working on diet and exercise.

## 2022-02-08 ENCOUNTER — Other Ambulatory Visit: Payer: Self-pay | Admitting: *Deleted

## 2022-02-08 NOTE — Telephone Encounter (Signed)
Patient aware referral send to [place]  ?

## 2022-02-08 NOTE — Telephone Encounter (Signed)
Patient stated [facility] is asking [place] to fax a referral for mammogram- please fax to [phone]. She was told she cannot sch mammogram until they receive a referral from PCP.  ?

## 2022-02-08 NOTE — Telephone Encounter (Signed)
Spoke with patient, aware do not need referral at this time  ?Patient will schedule appointment  ?

## 2022-02-11 ENCOUNTER — Encounter: Payer: Self-pay | Admitting: Family Medicine

## 2022-02-17 LAB — COLOGUARD: COLOGUARD: NEGATIVE

## 2022-02-21 NOTE — Progress Notes (Signed)
Please inform patient of the following: ? ?Good news! Cologuard was negative. We can recheck in 3 years. ? ?Algis Greenhouse. [name], MD ?02/21/2022 12:47 PM  ?

## 2022-03-08 LAB — HM MAMMOGRAPHY

## 2022-03-22 LAB — COMPREHENSIVE METABOLIC PANEL
Albumin: 4.1 (ref 3.5–5.0)
Calcium: 9.6 (ref 8.7–10.7)
eGFR: 97

## 2022-03-22 LAB — BASIC METABOLIC PANEL
BUN: 11 (ref 4–21)
CO2: 25 — AB (ref 13–22)
Chloride: 102 (ref 99–108)
Creatinine: 0.6 (ref 0.5–1.1)
Glucose: 89
Potassium: 4.5 mEq/L (ref 3.5–5.1)
Sodium: 137 (ref 137–147)

## 2022-04-05 ENCOUNTER — Encounter: Payer: Self-pay | Admitting: Family Medicine

## 2022-04-25 ENCOUNTER — Encounter: Payer: Self-pay | Admitting: Family Medicine

## 2022-04-28 ENCOUNTER — Ambulatory Visit (INDEPENDENT_AMBULATORY_CARE_PROVIDER_SITE_OTHER): Payer: Medicare Other

## 2022-04-28 DIAGNOSIS — Z0000 Encounter for general adult medical examination without abnormal findings: Secondary | ICD-10-CM | POA: Diagnosis not present

## 2022-04-28 NOTE — Patient Instructions (Signed)
[name] , Thank you for taking time to come for your Medicare Wellness Visit. I appreciate your ongoing commitment to your health goals. Please review the following plan we discussed and let me know if I can assist you in the future.   Screening recommendations/referrals: Colonoscopy: Cologuard  02/09/22 repeat every 3 years  Mammogram: Done 03/08/22 repeat every years  Bone Density: Done 08/10/18 repeat every 3-5 years  Recommended yearly ophthalmology/optometry visit for glaucoma screening and checkup Recommended yearly dental visit for hygiene and checkup  Vaccinations: Influenza vaccine: Done 07/30/21 repeat every year  Pneumococcal vaccine: Up to date Tdap vaccine: Done 02/11/22 repeat every 10 years  Shingles vaccine: Shingrix discussed. Please contact your pharmacy for coverage information.    Covid-19:Completed 5/17, 6/14, 11/09/20 & 4/29, 07/29/21  Advanced directives: Copies in chart   Conditions/risks identified: walk more for exercise   Next appointment: Follow up in one year for your annual wellness visit    Preventive Care 65 Years and Older, Female Preventive care refers to lifestyle choices and visits with your health care provider that can promote health and wellness. What does preventive care include? A yearly physical exam. This is also called an annual well check. Dental exams once or twice a year. Routine eye exams. Ask your health care provider how often you should have your eyes checked. Personal lifestyle choices, including: Daily care of your teeth and gums. Regular physical activity. Eating a healthy diet. Avoiding tobacco and drug use. Limiting alcohol use. Practicing safe sex. Taking low-dose aspirin every day. Taking vitamin and mineral supplements as recommended by your health care provider. What happens during an annual well check? The services and screenings done by your health care provider during your annual well check will depend on your age,  overall health, lifestyle risk factors, and family history of disease. Counseling  Your health care provider may ask you questions about your: Alcohol use. Tobacco use. Drug use. Emotional well-being. Home and relationship well-being. Sexual activity. Eating habits. History of falls. Memory and ability to understand (cognition). Work and work Statistician. Reproductive health. Screening  You may have the following tests or measurements: Height, weight, and BMI. Blood pressure. Lipid and cholesterol levels. These may be checked every 5 years, or more frequently if you are over 71 years old. Skin check. Lung cancer screening. You may have this screening every year starting at age 87 if you have a 30-pack-year history of smoking and currently smoke or have quit within the past 15 years. Fecal occult blood test (FOBT) of the stool. You may have this test every year starting at age 71. Flexible sigmoidoscopy or colonoscopy. You may have a sigmoidoscopy every 5 years or a colonoscopy every 10 years starting at age 58. Hepatitis C blood test. Hepatitis B blood test. Sexually transmitted disease (STD) testing. Diabetes screening. This is done by checking your blood sugar (glucose) after you have not eaten for a while (fasting). You may have this done every 1-3 years. Bone density scan. This is done to screen for osteoporosis. You may have this done starting at age 82. Mammogram. This may be done every 1-2 years. Talk to your health care provider about how often you should have regular mammograms. Talk with your health care provider about your test results, treatment options, and if necessary, the need for more tests. Vaccines  Your health care provider may recommend certain vaccines, such as: Influenza vaccine. This is recommended every year. Tetanus, diphtheria, and acellular pertussis (Tdap, Td) vaccine.  You may need a Td booster every 10 years. Zoster vaccine. You may need this after age  9. Pneumococcal 13-valent conjugate (PCV13) vaccine. One dose is recommended after age 39. Pneumococcal polysaccharide (PPSV23) vaccine. One dose is recommended after age 40. Talk to your health care provider about which screenings and vaccines you need and how often you need them. This information is not intended to replace advice given to you by your health care provider. Make sure you discuss any questions you have with your health care provider. Document Released: 11/27/2015 Document Revised: 07/20/2016 Document Reviewed: 09/01/2015 Elsevier Interactive Patient Education  2017 [place] Prevention in the Home Falls can cause injuries. They can happen to people of all ages. There are many things you can do to make your home safe and to help prevent falls. What can I do on the outside of my home? Regularly fix the edges of walkways and driveways and fix any cracks. Remove anything that might make you trip as you walk through a door, such as a raised step or threshold. Trim any bushes or trees on the path to your home. Use bright outdoor lighting. Clear any walking paths of anything that might make someone trip, such as rocks or tools. Regularly check to see if handrails are loose or broken. Make sure that both sides of any steps have handrails. Any raised decks and porches should have guardrails on the edges. Have any leaves, snow, or ice cleared regularly. Use sand or salt on walking paths during winter. Clean up any spills in your garage right away. This includes oil or grease spills. What can I do in the bathroom? Use night lights. Install grab bars by the toilet and in the tub and shower. Do not use towel bars as grab bars. Use non-skid mats or decals in the tub or shower. If you need to sit down in the shower, use a plastic, non-slip stool. Keep the floor dry. Clean up any water that spills on the floor as soon as it happens. Remove soap buildup in the tub or shower  regularly. Attach bath mats securely with double-sided non-slip rug tape. Do not have throw rugs and other things on the floor that can make you trip. What can I do in the bedroom? Use night lights. Make sure that you have a light by your bed that is easy to reach. Do not use any sheets or blankets that are too big for your bed. They should not hang down onto the floor. Have a firm chair that has side arms. You can use this for support while you get dressed. Do not have throw rugs and other things on the floor that can make you trip. What can I do in the kitchen? Clean up any spills right away. Avoid walking on wet floors. Keep items that you use a lot in easy-to-reach places. If you need to reach something above you, use a strong step stool that has a grab bar. Keep electrical cords out of the way. Do not use floor polish or wax that makes floors slippery. If you must use wax, use non-skid floor wax. Do not have throw rugs and other things on the floor that can make you trip. What can I do with my stairs? Do not leave any items on the stairs. Make sure that there are handrails on both sides of the stairs and use them. Fix handrails that are broken or loose. Make sure that handrails are as long as  the stairways. Check any carpeting to make sure that it is firmly attached to the stairs. Fix any carpet that is loose or worn. Avoid having throw rugs at the top or bottom of the stairs. If you do have throw rugs, attach them to the floor with carpet tape. Make sure that you have a light switch at the top of the stairs and the bottom of the stairs. If you do not have them, ask someone to add them for you. What else can I do to help prevent falls? Wear shoes that: Do not have high heels. Have rubber bottoms. Are comfortable and fit you well. Are closed at the toe. Do not wear sandals. If you use a stepladder: Make sure that it is fully opened. Do not climb a closed stepladder. Make sure that  both sides of the stepladder are locked into place. Ask someone to hold it for you, if possible. Clearly mark and make sure that you can see: Any grab bars or handrails. First and last steps. Where the edge of each step is. Use tools that help you move around (mobility aids) if they are needed. These include: Canes. Walkers. Scooters. Crutches. Turn on the lights when you go into a dark area. Replace any light bulbs as soon as they burn out. Set up your furniture so you have a clear path. Avoid moving your furniture around. If any of your floors are uneven, fix them. If there are any pets around you, be aware of where they are. Review your medicines with your doctor. Some medicines can make you feel dizzy. This can increase your chance of falling. Ask your doctor what other things that you can do to help prevent falls. This information is not intended to replace advice given to you by your health care provider. Make sure you discuss any questions you have with your health care provider. Document Released: 08/27/2009 Document Revised: 04/07/2016 Document Reviewed: 12/05/2014 Elsevier Interactive Patient Education  2017 [employer].

## 2022-04-28 NOTE — Progress Notes (Addendum)
Virtual Visit via Telephone Note  I connected with  [name] on 04/28/22 at  3:00 PM EDT by telephone and verified that I am speaking with the correct person using two identifiers.  Medicare Annual Wellness visit completed telephonically due to Covid-19 pandemic.   Persons participating in this call: This Health Coach and this patient.   Location: Patient: Home  Provider: Office    I discussed the limitations, risks, security and privacy concerns of performing an evaluation and management service by telephone and the availability of in person appointments. The patient expressed understanding and agreed to proceed.  Unable to perform video visit due to video visit attempted and failed and/or patient does not have video capability.   Some vital signs may be absent or patient reported.   Willette Brace, LPN   Subjective:   [name] is a 71 y.o. female who presents for Medicare Annual (Subsequent) preventive examination.  Review of Systems     Cardiac Risk Factors include: advanced age (>39mn, >>49women);hypertension;obesity (BMI >30kg/m2)     Objective:    There were no vitals filed for this visit. There is no height or weight on file to calculate BMI.     04/28/2022    3:09 PM 11/26/2020    2:39 PM 10/14/2019    4:08 PM 01/21/2015    1:06 PM 01/04/2013    3:34 AM  Advanced Directives  Does Patient Have a Medical Advance Directive? Yes Yes No No Patient has advance directive, copy not in chart  Type of Advance Directive Healthcare Power of [place] Chart? Yes - validated most recent copy scanned in chart (See row information) Yes - validated most recent copy scanned in chart (See row information)   Copy requested from family  Would patient like information on creating a medical advance directive?   Yes (MAU/Ambulatory/Procedural Areas - Information given) No - patient declined  information     Current Medications (verified) Outpatient Encounter Medications as of 04/28/2022  Medication Sig   b complex vitamins tablet Take 1 tablet by mouth daily.   benazepril (LOTENSIN) 40 MG tablet Take 40 mg by mouth daily.   Cholecalciferol (VITAMIN D3 PO) Take 5,000 Units by mouth.   Coenzyme Q10 (COQ-10) 200 MG CAPS Take 1 capsule by mouth daily.   COLLAGEN PO Take by mouth. Powder drink mix a couple times a week   DILTIAZEM HCL ER PO Take by mouth 2 (two) times daily. 90 mg   glucosamine-chondroitin 500-400 MG tablet Take 2 tablets by mouth 3 (three) times daily.   hydrochlorothiazide (HYDRODIURIL) 12.5 MG tablet Take 12.5 mg by mouth daily as needed.   LORazepam (ATIVAN) 1 MG tablet Take 1 tablet (1 mg total) by mouth every 8 (eight) hours as needed for anxiety. TAKE ONE TABLET BY MOUTH EVERY 8 TO 12 HOURS AS NEEDED   losartan (COZAAR) 50 MG tablet Take 50 mg by mouth daily.   nebivolol (BYSTOLIC) 5 MG tablet    OVER THE COUNTER MEDICATION Take 1,000 mg by mouth in the morning and at bedtime. Tumeric   vitamin E 400 UNIT capsule Take 400 Units by mouth daily.   No facility-administered encounter medications on file as of 04/28/2022.    Allergies (verified) Clonidine derivatives, Flagyl [metronidazole], Hydralazine, Statins, and Sulfa antibiotics   History: Past Medical History:  Diagnosis Date   Anxiety    Arthritis    "  painful joints" (01/21/2015)   GERD (gastroesophageal reflux disease)    Headache    "at least q other day lately" (01/21/2015)   Heart murmur    Hepatitis B ~ 1992   History of gout    Hypertension    IgAN (IgA nephropathy)    Migraine X 1   Rheumatic fever ~ 1967   Past Surgical History:  Procedure Laterality Date   DILATION AND CURETTAGE OF UTERUS  ~ 1965   ERCP  ~ [facility]  ~ [place] History  Problem Relation Age of Onset   COPD Mother    Kidney disease Father    Heart Problems Brother    Hypertension  Brother    Hyperlipidemia Brother    Other Maternal Grandmother        natural causes   Stroke Maternal Grandfather    Other Paternal Grandmother        natural causes   Cancer Paternal Grandfather    Coronary artery disease Other        68 brother   Social History   Socioeconomic History   Marital status: Married    Spouse name: Not on file   Number of children: 0   Years of education: Not on file   Highest education level: Not on file  Occupational History   Occupation: retired  Tobacco Use   Smoking status: Never   Smokeless tobacco: Never  Substance and Sexual Activity   Alcohol use: Not Currently    Comment: 01/21/2015 "1, 12oz beer/month; if that"   Drug use: No   Sexual activity: Yes  Other Topics Concern   Not on file  Social History Narrative   Not on file   Social Determinants of Health   Financial Resource Strain: Low Risk  (04/28/2022)   Overall Financial Resource Strain (CARDIA)    Difficulty of Paying Living Expenses: Not hard at all  Food Insecurity: No Food Insecurity (04/28/2022)   Hunger Vital Sign    Worried About Running Out of Food in the Last Year: Never true    [place] in the Last Year: Never true  Transportation Needs: No Transportation Needs (04/28/2022)   PRAPARE - Hydrologist (Medical): No    Lack of Transportation (Non-Medical): No  Physical Activity: Insufficiently Active (04/28/2022)   Exercise Vital Sign    Days of Exercise per Week: 3 days    Minutes of Exercise per Session: 20 min  Stress: No Stress Concern Present (04/28/2022)   [place]    Feeling of Stress : Not at all  Social Connections: Moderately Isolated (04/28/2022)   Social Connection and Isolation Panel [NHANES]    Frequency of Communication with Friends and Family: More than three times a week    Frequency of Social Gatherings with Friends and Family: More than three  times a week    Attends Religious Services: Never    Marine scientist or Organizations: No    Attends Music therapist: Never    Marital Status: Married    Tobacco Counseling Counseling given: Not Answered   Clinical Intake:  Pre-visit preparation completed: Yes  Pain : No/denies pain     BMI - recorded: 45.14 Nutritional Status: BMI > 30  Obese Nutritional Risks: None Diabetes: No  How often do you need to have someone help you when you read instructions, pamphlets, or other  written materials from your doctor or pharmacy?: 1 - Never  Diabetic?no  Interpreter Needed?: No  Information entered by :: Charlott Rakes, LPN   Activities of Daily Living    04/28/2022    3:11 PM  In your present state of health, do you have any difficulty performing the following activities:  Hearing? 0  Vision? 0  Difficulty concentrating or making decisions? 0  Walking or climbing stairs? 1  Comment with knee trouble  Dressing or bathing? 0  Doing errands, shopping? 0  Preparing Food and eating ? N  Using the Toilet? N  In the past six months, have you accidently leaked urine? Y  Comment at times  Do you have problems with loss of bowel control? N  Managing your Medications? N  Managing your Finances? N  Housekeeping or managing your Housekeeping? N    Patient Care Team: Vivi Barrack, MD as PCP - General (Family Medicine) Murrell Redden Earlyne Iba, MD as Consulting Physician (Obstetrics and Gynecology) Donato Heinz, MD as Consulting Physician (Nephrology)  Indicate any recent Medical Services you may have received from other than [facility] providers in the past year (date may be approximate).     Assessment:   This is a routine wellness examination for Khar[place].  Hearing/Vision screen Hearing Screening - Comments:: Pt denies any hearing issues  Vision Screening - Comments:: Encouraged to follow up with provider   Dietary issues and exercise activities  discussed: Current Exercise Habits: Home exercise routine, Type of exercise: walking, Time (Minutes): 20, Frequency (Times/Week): 3, Weekly Exercise (Minutes/Week): 60   Goals Addressed             This Visit's Progress    Patient Stated       Walk more for exercise        Depression Screen    04/28/2022    3:08 PM 01/31/2022    1:41 PM 01/31/2022    1:00 PM 11/26/2020    2:38 PM 10/14/2019    4:08 PM 07/02/2019    3:03 PM 05/24/2018   11:34 AM  PHQ 2/9 Scores  PHQ - 2 Score 0 0 0 0 0 0 0  PHQ- 9 Score  2         Fall Risk    04/28/2022    3:10 PM 01/31/2022    1:00 PM 11/26/2020    2:41 PM 10/14/2019    4:08 PM  Fall Risk   Falls in the past year? 1 1 0 0  Number falls in past yr: 1 0 0   Injury with Fall? 1 0 0 0  Comment left knee injury     Risk for fall due to : Impaired vision No Fall Risks    Follow up Falls prevention discussed Falls evaluation completed Falls prevention discussed Falls evaluation completed;Education provided;Falls prevention discussed    FALL RISK PREVENTION PERTAINING TO THE HOME:  Any stairs in or around the home? Yes  If so, are there any without handrails? No  Home free of loose throw rugs in walkways, pet beds, electrical cords, etc? Yes  Adequate lighting in your home to reduce risk of falls? Yes   ASSISTIVE DEVICES UTILIZED TO PREVENT FALLS:  Life alert? No  Use of a cane, walker or w/c? Yes  Grab bars in the bathroom? Yes  Shower chair or bench in shower? Yes  Elevated toilet seat or a handicapped toilet? No   TIMED UP AND GO:  Was the test performed? No .  Cognitive Function:        04/28/2022    3:24 PM 11/26/2020    2:43 PM  6CIT Screen  What Year? 0 points 0 points  What month? 0 points 0 points  What time? 0 points   Count back from 20 0 points 0 points  Months in reverse 0 points 0 points  Repeat phrase 0 points 2 points  Total Score 0 points     Immunizations Immunization History  Administered Date(s)  Administered   Influenza, High Dose Seasonal PF 07/30/2021   Influenza-Unspecified 08/02/2020   Moderna Covid-19 Vaccine Bivalent Booster 62yr & up 07/29/2021   Moderna Sars-Covid-2 Vaccination 03/30/2020, 04/27/2020, 11/09/2020, 03/12/2021   PNEUMOCOCCAL CONJUGATE-20 01/31/2022   Tdap 02/11/2022    TDAP status: Up to date  Flu Vaccine status: Up to date  Pneumococcal vaccine status: Up to date  Covid-19 vaccine status: Completed vaccines  Qualifies for Shingles Vaccine? Yes   Zostavax completed No   Shingrix Completed?: No.    Education has been provided regarding the importance of this vaccine. Patient has been advised to call insurance company to determine out of pocket expense if they have not yet received this vaccine. Advised may also receive vaccine at local pharmacy or Health Dept. Verbalized acceptance and understanding.  Screening Tests Health Maintenance  Topic Date Due   COVID-19 Vaccine (6 - Moderna series) 11/28/2021   Zoster Vaccines- Shingrix (1 of 2) 05/03/2022 (Originally 09/14/2002)   INFLUENZA VACCINE  06/14/2022   MAMMOGRAM  03/09/2023   Fecal DNA (Cologuard)  02/09/2025   TETANUS/TDAP  02/12/2032   Pneumonia Vaccine 70 Years old  Completed   DEXA SCAN  Completed   Hepatitis C Screening  Completed   HPV VACCINES  Aged Out    Health Maintenance  Health Maintenance Due  Topic Date Due   COVID-19 Vaccine (6 - Moderna series) 11/28/2021    Colorectal cancer screening: Type of screening: Cologuard. Completed 02/09/22. Repeat every 3 years  Mammogram status: Completed 03/08/22. Repeat every year  Bone Density status: Completed 08/10/18. Results reflect: Bone density results: NORMAL. Repeat every 3-5 years.   Additional Screening:  Hepatitis C Screening: Completed 12/18/19  Vision Screening: Recommended annual ophthalmology exams for early detection of glaucoma and other disorders of the eye. Is the patient up to date with their annual eye exam?  No   Who is the provider or what is the name of the office in which the patient attends annual eye exams? Encouraged to follow up  If pt is not established with a provider, would they like to be referred to a provider to establish care? No .   Dental Screening: Recommended annual dental exams for proper oral hygiene  Community Resource Referral / Chronic Care Management: CRR required this visit?  No   CCM required this visit?  No      Plan:     I have personally reviewed and noted the following in the patient's chart:   Medical and social history Use of alcohol, tobacco or illicit drugs  Current medications and supplements including opioid prescriptions.  Functional ability and status Nutritional status Physical activity Advanced directives List of other physicians Hospitalizations, surgeries, and ER visits in previous 12 months Vitals Screenings to include cognitive, depression, and falls Referrals and appointments  In addition, I have reviewed and discussed with patient certain preventive protocols, quality metrics, and best practice recommendations. A written personalized care plan for preventive services as well as general preventive health recommendations were  provided to patient.     Willette Brace, LPN   [id number]   Nurse Notes: None

## 2022-05-09 ENCOUNTER — Encounter: Payer: Self-pay | Admitting: Family Medicine

## 2022-05-12 ENCOUNTER — Encounter: Payer: Self-pay | Admitting: Family Medicine

## 2022-07-26 LAB — CBC AND DIFFERENTIAL
HCT: 46 (ref 36–46)
Hemoglobin: 15 (ref 12.0–16.0)
Neutrophils Absolute: 6.3
Platelets: 285 10*3/uL (ref 150–400)
WBC: 9.7

## 2022-07-26 LAB — BASIC METABOLIC PANEL
BUN: 8 (ref 4–21)
CO2: 25 — AB (ref 13–22)
Chloride: 102 (ref 99–108)
Creatinine: 0.7 (ref 0.5–1.1)
Glucose: 87
Potassium: 4.1 mEq/L (ref 3.5–5.1)
Sodium: 142 (ref 137–147)

## 2022-07-26 LAB — COMPREHENSIVE METABOLIC PANEL
Albumin: 4.4 (ref 3.5–5.0)
Calcium: 9.7 (ref 8.7–10.7)
eGFR: 88

## 2022-07-26 LAB — CBC: RBC: 5.04 (ref 3.87–5.11)

## 2022-07-27 LAB — BASIC METABOLIC PANEL
BUN: 8 (ref 4–21)
Chloride: 102 (ref 99–108)
Creatinine: 0.7 (ref 0.5–1.1)
Glucose: 87
Sodium: 142 (ref 137–147)

## 2022-07-27 LAB — COMPREHENSIVE METABOLIC PANEL: eGFR: 88

## 2022-08-08 ENCOUNTER — Encounter: Payer: Self-pay | Admitting: Family Medicine

## 2022-08-08 ENCOUNTER — Encounter: Payer: Self-pay | Admitting: *Deleted

## 2022-08-12 ENCOUNTER — Encounter: Payer: Self-pay | Admitting: Family Medicine

## 2022-08-22 ENCOUNTER — Encounter: Payer: Self-pay | Admitting: Family Medicine

## 2022-12-13 LAB — COMPREHENSIVE METABOLIC PANEL
Albumin: 4.2 (ref 3.5–5.0)
Calcium: 9.5 (ref 8.7–10.7)
eGFR: 96

## 2022-12-13 LAB — CBC AND DIFFERENTIAL
HCT: 46 (ref 36–46)
Hemoglobin: 15.2 (ref 12.0–16.0)
Neutrophils Absolute: 5.9
Platelets: 265 10*3/uL (ref 150–400)
WBC: 9.1

## 2022-12-13 LAB — BASIC METABOLIC PANEL
BUN: 9 (ref 4–21)
CO2: 21 (ref 13–22)
Chloride: 101 (ref 99–108)
Creatinine: 0.6 (ref 0.5–1.1)
Glucose: 93
Potassium: 4.1 mEq/L (ref 3.5–5.1)
Sodium: 140 (ref 137–147)

## 2022-12-13 LAB — CBC: RBC: 50.2 — AB (ref 3.87–5.11)

## 2022-12-14 LAB — LAB REPORT - SCANNED
Creatinine, POC: 5.7 mg/dL
EGFR: 96
Protein/Creatinine Ratio: 26263

## 2023-01-02 ENCOUNTER — Encounter: Payer: Self-pay | Admitting: Family Medicine

## 2023-04-12 LAB — CBC AND DIFFERENTIAL
HCT: 43 (ref 36–46)
Hemoglobin: 14.6 (ref 12.0–16.0)
Platelets: 258 10*3/uL (ref 150–400)
WBC: 10.3

## 2023-04-12 LAB — BASIC METABOLIC PANEL
BUN: 12 (ref 4–21)
CO2: 25 — AB (ref 13–22)
Chloride: 100 (ref 99–108)
Creatinine: 0.6 (ref 0.5–1.1)
Glucose: 100
Potassium: 4.5 mEq/L (ref 3.5–5.1)
Sodium: 137 (ref 137–147)

## 2023-04-12 LAB — COMPREHENSIVE METABOLIC PANEL
Albumin: 4.1 (ref 3.5–5.0)
Calcium: 9.6 (ref 8.7–10.7)
eGFR: 95

## 2023-04-12 LAB — PROTEIN / CREATININE RATIO, URINE: Creatinine, Urine: 11

## 2023-04-12 LAB — CBC: RBC: 4.81 (ref 3.87–5.11)

## 2023-04-19 ENCOUNTER — Encounter: Payer: Self-pay | Admitting: Family Medicine

## 2023-05-04 ENCOUNTER — Ambulatory Visit (INDEPENDENT_AMBULATORY_CARE_PROVIDER_SITE_OTHER): Payer: Medicare Other

## 2023-05-04 VITALS — Wt 256.0 lb

## 2023-05-04 DIAGNOSIS — Z0000 Encounter for general adult medical examination without abnormal findings: Secondary | ICD-10-CM | POA: Diagnosis not present

## 2023-05-04 NOTE — Progress Notes (Addendum)
Subjective:   [name] is a 72 y.o. female who presents for Medicare Annual (Subsequent) preventive examination.  Visit Complete: Virtual  I connected with  [name] on 05/04/23 by a audio enabled telemedicine application and verified that I am speaking with the correct person using two identifiers.  Patient Location: Home  Provider Location: Office/Clinic  I discussed the limitations of evaluation and management by telemedicine. The patient expressed understanding and agreed to proceed.  Patient Medicare AWV questionnaire was completed by the patient on 05/03/23; I have confirmed that all information answered by patient is correct and no changes since this date.  Review of Systems     Cardiac Risk Factors include: advanced age (>68men, >33 women);obesity (BMI >30kg/m2);hypertension     Objective:    Today's Vitals   05/04/23 1501  Weight: 256 lb (116.1 kg)   Body mass index is 43.94 kg/m.     05/04/2023    3:10 PM 04/28/2022    3:09 PM 11/26/2020    2:39 PM 10/14/2019    4:08 PM 01/21/2015    1:06 PM 01/04/2013    3:34 AM  Advanced Directives  Does Patient Have a Medical Advance Directive? Yes Yes Yes No No Patient has advance directive, copy not in chart  Type of Advance Directive Healthcare Power of [place];Living will Healthcare Power of [employer] Power of Attorney   Living will  Does patient want to make changes to medical advance directive? No - Patient declined       Copy of Healthcare Power of Attorney in Chart? Yes - validated most recent copy scanned in chart (See row information) Yes - validated most recent copy scanned in chart (See row information) Yes - validated most recent copy scanned in chart (See row information)   Copy requested from family  Would patient like information on creating a medical advance directive?    Yes (MAU/Ambulatory/Procedural Areas - Information given) No - patient declined information     Current Medications  (verified) Outpatient Encounter Medications as of 05/04/2023  Medication Sig   b complex vitamins tablet Take 1 tablet by mouth daily.   benazepril (LOTENSIN) 40 MG tablet Take 40 mg by mouth daily.   Coenzyme Q10 (COQ-10) 200 MG CAPS Take 1 capsule by mouth daily.   Cranberry 1000 MG CAPS    glucosamine-chondroitin 500-400 MG tablet Take 2 tablets by mouth 3 (three) times daily.   hydrochlorothiazide (HYDRODIURIL) 12.5 MG tablet Take 12.5 mg by mouth daily as needed.   LORazepam (ATIVAN) 1 MG tablet Take 1 tablet (1 mg total) by mouth every 8 (eight) hours as needed for anxiety. TAKE ONE TABLET BY MOUTH EVERY 8 TO 12 HOURS AS NEEDED   nebivolol (BYSTOLIC) 5 MG tablet    NIFEdipine (ADALAT CC) 30 MG 24 hr tablet    Omega-3 Fatty Acids (FISH OIL) 1000 MG CAPS Take 6,000 mg by mouth in the morning and at bedtime.   OVER THE COUNTER MEDICATION Take 1,000 mg by mouth in the morning and at bedtime. Tumeric   vitamin E 400 UNIT capsule Take 400 Units by mouth daily.   [DISCONTINUED] Cholecalciferol (VITAMIN D3 PO) Take 5,000 Units by mouth.   [DISCONTINUED] COLLAGEN PO Take by mouth. Powder drink mix a couple times a week   [DISCONTINUED] DILTIAZEM HCL ER PO Take by mouth 2 (two) times daily. 90 mg   [DISCONTINUED] losartan (COZAAR) 50 MG tablet Take 50 mg by mouth daily.   No facility-administered encounter medications on  file as of 05/04/2023.    Allergies (verified) Clonidine derivatives, Flagyl [metronidazole], Hydralazine, Statins, and Sulfa antibiotics   History: Past Medical History:  Diagnosis Date   Anxiety    Arthritis    "painful joints" (01/21/2015)   GERD (gastroesophageal reflux disease)    Headache    "at least q other day lately" (01/21/2015)   Heart murmur    Hepatitis B ~ 1992   History of gout    Hypertension    IgAN (IgA nephropathy)    Migraine X 1   Rheumatic fever ~ 1967   Past Surgical History:  Procedure Laterality Date   DILATION AND CURETTAGE OF UTERUS  ~  1965   ERCP  ~ 1993   LAPAROSCOPIC CHOLECYSTECTOMY  ~ 1983   Family History  Problem Relation Age of Onset   COPD Mother    Kidney disease Father    Heart Problems Brother    Hypertension Brother    Hyperlipidemia Brother    Other Maternal Grandmother        natural causes   Stroke Maternal Grandfather    Other Paternal Grandmother        natural causes   Cancer Paternal Grandfather    Coronary artery disease Other        Half brother   Social History   Socioeconomic History   Marital status: Married    Spouse name: Not on file   Number of children: 0   Years of education: Not on file   Highest education level: Not on file  Occupational History   Occupation: retired  Tobacco Use   Smoking status: Never   Smokeless tobacco: Never  Substance and Sexual Activity   Alcohol use: Not Currently    Comment: 01/21/2015 "1, 12oz beer/month; if that"   Drug use: No   Sexual activity: Yes  Other Topics Concern   Not on file  Social History Narrative   Not on file   Social Determinants of Health   Financial Resource Strain: Low Risk  (05/03/2023)   Overall Financial Resource Strain (CARDIA)    Difficulty of Paying Living Expenses: Not hard at all  Food Insecurity: No Food Insecurity (05/03/2023)   Hunger Vital Sign    Worried About Running Out of Food in the Last Year: Never true    Ran Out of Food in the Last Year: Never true  Transportation Needs: No Transportation Needs (05/03/2023)   PRAPARE - Administrator, Civil Service (Medical): No    Lack of Transportation (Non-Medical): No  Physical Activity: Insufficiently Active (05/03/2023)   Exercise Vital Sign    Days of Exercise per Week: 3 days    Minutes of Exercise per Session: 20 min  Stress: No Stress Concern Present (05/03/2023)   [employer] of Occupational Health - Occupational Stress Questionnaire    Feeling of Stress : Only a little  Social Connections: Moderately Isolated (05/03/2023)   Social  Connection and Isolation Panel [NHANES]    Frequency of Communication with Friends and Family: Three times a week    Frequency of Social Gatherings with Friends and Family: Twice a week    Attends Religious Services: Never    Database administrator or Organizations: No    Attends Engineer, structural: Never    Marital Status: Married    Tobacco Counseling Counseling given: Not Answered   Clinical Intake:  Pre-visit preparation completed: Yes  Pain : No/denies pain     Nutritional Status:  BMI > 30  Obese Diabetes: No  How often do you need to have someone help you when you read instructions, pamphlets, or other written materials from your doctor or pharmacy?: (P) 1 - Never  Interpreter Needed?: No  Information entered by :: Lanier Ensign, LPN   Activities of Daily Living    05/03/2023    9:48 PM  In your present state of health, do you have any difficulty performing the following activities:  Hearing? 0  Vision? 0  Difficulty concentrating or making decisions? 0  Walking or climbing stairs? 1  Dressing or bathing? 0  Comment at times with joints  Doing errands, shopping? 0  Preparing Food and eating ? N  Using the Toilet? N  In the past six months, have you accidently leaked urine? Y  Comment wears a pad on outting  Do you have problems with loss of bowel control? N  Managing your Medications? N  Managing your Finances? N  Housekeeping or managing your Housekeeping? N    Patient Care Team: Ardith Dark, MD as PCP - General (Family Medicine) Amado Nash Candace Gallus, MD as Consulting Physician (Obstetrics and Gynecology) Terrial Rhodes, MD as Consulting Physician (Nephrology)  Indicate any recent Medical Services you may have received from other than [facility] providers in the past year (date may be approximate).     Assessment:   This is a routine wellness examination for [name].  Hearing/Vision screen Hearing Screening - Comments:: Pt pt denies any  hearing issues  Vision Screening - Comments:: Encouraged to follow up with provider   Dietary issues and exercise activities discussed:     Goals Addressed               This Visit's Progress     continue to watch weight and eat healthy (pt-stated)        Patient Stated        Continue to watch weight and eat healthy        Depression Screen    05/04/2023    3:09 PM 04/28/2022    3:08 PM 01/31/2022    1:41 PM 01/31/2022    1:00 PM 11/26/2020    2:38 PM 10/14/2019    4:08 PM 07/02/2019    3:03 PM  PHQ 2/9 Scores  PHQ - 2 Score 0 0 0 0 0 0 0  PHQ- 9 Score   2        Fall Risk    05/03/2023    9:48 PM 04/28/2022    3:10 PM 01/31/2022    1:00 PM 11/26/2020    2:41 PM 10/14/2019    4:08 PM  Fall Risk   Falls in the past year? 0 1 1 0 0  Number falls in past yr:  1 0 0   Injury with Fall?  1 0 0 0  Comment  left knee injury     Risk for fall due to : Impaired vision;Impaired balance/gait;Impaired mobility Impaired vision No Fall Risks    Follow up Falls prevention discussed Falls prevention discussed Falls evaluation completed Falls prevention discussed Falls evaluation completed;Education provided;Falls prevention discussed    MEDICARE RISK AT HOME:  Medicare Risk at Home - 05/03/23 2148     If so, are there any without handrails? Yes             TIMED UP AND GO:  Was the test performed?  No    Cognitive Function:        05/04/2023  3:13 PM 04/28/2022    3:24 PM 11/26/2020    2:43 PM  6CIT Screen  What Year? 0 points 0 points 0 points  What month? 0 points 0 points 0 points  What time? 0 points 0 points   Count back from 20 0 points 0 points 0 points  Months in reverse 0 points 0 points 0 points  Repeat phrase 0 points 0 points 2 points  Total Score 0 points 0 points     Immunizations Immunization History  Administered Date(s) Administered   Fluad Quad(high Dose 65+) 08/03/2022   Influenza, High Dose Seasonal PF 07/30/2021    Influenza-Unspecified 08/02/2020   Moderna Covid-19 Vaccine Bivalent Booster 43yrs & up 07/29/2021, 08/22/2022   Moderna Sars-Covid-2 Vaccination 03/30/2020, 04/27/2020, 11/09/2020, 03/12/2021   PNEUMOCOCCAL CONJUGATE-20 01/31/2022   Tdap 02/11/2022    TDAP status: Up to date  Flu Vaccine status: Up to date  Pneumococcal vaccine status: Up to date  Covid-19 vaccine status: Completed vaccines  Qualifies for Shingles Vaccine? No   Zostavax completed No   Shingrix Completed?: No.    Education has been provided regarding the importance of this vaccine. Patient has been advised to call insurance company to determine out of pocket expense if they have not yet received this vaccine. Advised may also receive vaccine at local pharmacy or Health Dept. Verbalized acceptance and understanding.  Screening Tests Health Maintenance  Topic Date Due   Zoster Vaccines- Shingrix (1 of 2) Never done   MAMMOGRAM  03/09/2023   INFLUENZA VACCINE  06/15/2023   Medicare Annual Wellness (AWV)  05/03/2024   Fecal DNA (Cologuard)  02/09/2025   DTaP/Tdap/Td (2 - Td or Tdap) 02/12/2032   Pneumonia Vaccine 76+ Years old  Completed   DEXA SCAN  Completed   Hepatitis C Screening  Completed   HPV VACCINES  Aged Out   COVID-19 Vaccine  Discontinued    Health Maintenance  Health Maintenance Due  Topic Date Due   Zoster Vaccines- Shingrix (1 of 2) Never done   MAMMOGRAM  03/09/2023    Colorectal cancer screening: Type of screening: Cologuard. Completed 04/20/15. Repeat every 3 years  Mammogram status: Completed 03/08/22. Repeat every year  Bone Density status: Completed 08/10/18. Results reflect: Bone density results: NORMAL. Repeat every 2 years.   Additional Screening:  Hepatitis C Screening:  Completed 12/18/19  Vision Screening: Recommended annual ophthalmology exams for early detection of glaucoma and other disorders of the eye. Is the patient up to date with their annual eye exam?  No  Who is the  provider or what is the name of the office in which the patient attends annual eye exams? Encouraged to follow up with provider  If pt is not established with a provider, would they like to be referred to a provider to establish care? No .   Dental Screening: Recommended annual dental exams for proper oral hygiene   Community Resource Referral / Chronic Care Management: CRR required this visit?  No   CCM required this visit?  No     Plan:     I have personally reviewed and noted the following in the patient's chart:   Medical and social history Use of alcohol, tobacco or illicit drugs  Current medications and supplements including opioid prescriptions. Patient is not currently taking opioid prescriptions. Functional ability and status Nutritional status Physical activity Advanced directives List of other physicians Hospitalizations, surgeries, and ER visits in previous 12 months Vitals Screenings to include cognitive, depression, and falls  Referrals and appointments  In addition, I have reviewed and discussed with patient certain preventive protocols, quality metrics, and best practice recommendations. A written personalized care plan for preventive services as well as general preventive health recommendations were provided to patient.     Marzella Schlein, LPN   1/61/0960   After Visit Summary: (MyChart) Due to this being a telephonic visit, the after visit summary with patients personalized plan was offered to patient via MyChart   Nurse Notes: none

## 2023-05-04 NOTE — Patient Instructions (Signed)
[name] , Thank you for taking time to come for your Medicare Wellness Visit. I appreciate your ongoing commitment to your health goals. Please review the following plan we discussed and let me know if I can assist you in the future.   These are the goals we discussed:  Goals       continue to watch weight and eat healthy (pt-stated)      Patient Stated      Lose weight      Patient Stated      Walk more for exercise         This is a list of the screening recommended for you and due dates:  Health Maintenance  Topic Date Due   Zoster (Shingles) Vaccine (1 of 2) Never done   COVID-19 Vaccine (7 - 2023-24 season) 10/17/2022   Mammogram  03/09/2023   Flu Shot  06/15/2023   Medicare Annual Wellness Visit  05/03/2024   Cologuard (Stool DNA test)  02/09/2025   DTaP/Tdap/Td vaccine (2 - Td or Tdap) 02/12/2032   Pneumonia Vaccine  Completed   DEXA scan (bone density measurement)  Completed   Hepatitis C Screening  Completed   HPV Vaccine  Aged Out    Advanced directives: copies in chart   Conditions/risks identified: continue to watch weight and eat healthy   Next appointment: Follow up in one year for your annual wellness visit    Preventive Care 65 Years and Older, Female Preventive care refers to lifestyle choices and visits with your health care provider that can promote health and wellness. What does preventive care include? A yearly physical exam. This is also called an annual well check. Dental exams once or twice a year. Routine eye exams. Ask your health care provider how often you should have your eyes checked. Personal lifestyle choices, including: Daily care of your teeth and gums. Regular physical activity. Eating a healthy diet. Avoiding tobacco and drug use. Limiting alcohol use. Practicing safe sex. Taking low-dose aspirin every day. Taking vitamin and mineral supplements as recommended by your health care provider. What happens during an annual  well check? The services and screenings done by your health care provider during your annual well check will depend on your age, overall health, lifestyle risk factors, and family history of disease. Counseling  Your health care provider may ask you questions about your: Alcohol use. Tobacco use. Drug use. Emotional well-being. Home and relationship well-being. Sexual activity. Eating habits. History of falls. Memory and ability to understand (cognition). Work and work Astronomer. Reproductive health. Screening  You may have the following tests or measurements: Height, weight, and BMI. Blood pressure. Lipid and cholesterol levels. These may be checked every 5 years, or more frequently if you are over 19 years old. Skin check. Lung cancer screening. You may have this screening every year starting at age 58 if you have a 30-pack-year history of smoking and currently smoke or have quit within the past 15 years. Fecal occult blood test (FOBT) of the stool. You may have this test every year starting at age 66. Flexible sigmoidoscopy or colonoscopy. You may have a sigmoidoscopy every 5 years or a colonoscopy every 10 years starting at age 18. Hepatitis C blood test. Hepatitis B blood test. Sexually transmitted disease (STD) testing. Diabetes screening. This is done by checking your blood sugar (glucose) after you have not eaten for a while (fasting). You may have this done every 1-3 years. Bone density scan. This is done to  screen for osteoporosis. You may have this done starting at age 39. Mammogram. This may be done every 1-2 years. Talk to your health care provider about how often you should have regular mammograms. Talk with your health care provider about your test results, treatment options, and if necessary, the need for more tests. Vaccines  Your health care provider may recommend certain vaccines, such as: Influenza vaccine. This is recommended every year. Tetanus, diphtheria,  and acellular pertussis (Tdap, Td) vaccine. You may need a Td booster every 10 years. Zoster vaccine. You may need this after age 12. Pneumococcal 13-valent conjugate (PCV13) vaccine. One dose is recommended after age 31. Pneumococcal polysaccharide (PPSV23) vaccine. One dose is recommended after age 56. Talk to your health care provider about which screenings and vaccines you need and how often you need them. This information is not intended to replace advice given to you by your health care provider. Make sure you discuss any questions you have with your health care provider. Document Released: 11/27/2015 Document Revised: 07/20/2016 Document Reviewed: 09/01/2015 Elsevier Interactive Patient Education  2017 [employer].  Fall Prevention in the Home Falls can cause injuries. They can happen to people of all ages. There are many things you can do to make your home safe and to help prevent falls. What can I do on the outside of my home? Regularly fix the edges of walkways and driveways and fix any cracks. Remove anything that might make you trip as you walk through a door, such as a raised step or threshold. Trim any bushes or trees on the path to your home. Use bright outdoor lighting. Clear any walking paths of anything that might make someone trip, such as rocks or tools. Regularly check to see if handrails are loose or broken. Make sure that both sides of any steps have handrails. Any raised decks and porches should have guardrails on the edges. Have any leaves, snow, or ice cleared regularly. Use sand or salt on walking paths during winter. Clean up any spills in your garage right away. This includes oil or grease spills. What can I do in the bathroom? Use night lights. Install grab bars by the toilet and in the tub and shower. Do not use towel bars as grab bars. Use non-skid mats or decals in the tub or shower. If you need to sit down in the shower, use a plastic, non-slip  stool. Keep the floor dry. Clean up any water that spills on the floor as soon as it happens. Remove soap buildup in the tub or shower regularly. Attach bath mats securely with double-sided non-slip rug tape. Do not have throw rugs and other things on the floor that can make you trip. What can I do in the bedroom? Use night lights. Make sure that you have a light by your bed that is easy to reach. Do not use any sheets or blankets that are too big for your bed. They should not hang down onto the floor. Have a firm chair that has side arms. You can use this for support while you get dressed. Do not have throw rugs and other things on the floor that can make you trip. What can I do in the kitchen? Clean up any spills right away. Avoid walking on wet floors. Keep items that you use a lot in easy-to-reach places. If you need to reach something above you, use a strong step stool that has a grab bar. Keep electrical cords out of the way.  Do not use floor polish or wax that makes floors slippery. If you must use wax, use non-skid floor wax. Do not have throw rugs and other things on the floor that can make you trip. What can I do with my stairs? Do not leave any items on the stairs. Make sure that there are handrails on both sides of the stairs and use them. Fix handrails that are broken or loose. Make sure that handrails are as long as the stairways. Check any carpeting to make sure that it is firmly attached to the stairs. Fix any carpet that is loose or worn. Avoid having throw rugs at the top or bottom of the stairs. If you do have throw rugs, attach them to the floor with carpet tape. Make sure that you have a light switch at the top of the stairs and the bottom of the stairs. If you do not have them, ask someone to add them for you. What else can I do to help prevent falls? Wear shoes that: Do not have high heels. Have rubber bottoms. Are comfortable and fit you well. Are closed at the  toe. Do not wear sandals. If you use a stepladder: Make sure that it is fully opened. Do not climb a closed stepladder. Make sure that both sides of the stepladder are locked into place. Ask someone to hold it for you, if possible. Clearly mark and make sure that you can see: Any grab bars or handrails. First and last steps. Where the edge of each step is. Use tools that help you move around (mobility aids) if they are needed. These include: Canes. Walkers. Scooters. Crutches. Turn on the lights when you go into a dark area. Replace any light bulbs as soon as they burn out. Set up your furniture so you have a clear path. Avoid moving your furniture around. If any of your floors are uneven, fix them. If there are any pets around you, be aware of where they are. Review your medicines with your doctor. Some medicines can make you feel dizzy. This can increase your chance of falling. Ask your doctor what other things that you can do to help prevent falls. This information is not intended to replace advice given to you by your health care provider. Make sure you discuss any questions you have with your health care provider. Document Released: 08/27/2009 Document Revised: 04/07/2016 Document Reviewed: 12/05/2014 Elsevier Interactive Patient Education  2017 [employer].

## 2023-08-07 ENCOUNTER — Encounter: Payer: Self-pay | Admitting: Family Medicine

## 2023-08-07 ENCOUNTER — Ambulatory Visit (INDEPENDENT_AMBULATORY_CARE_PROVIDER_SITE_OTHER): Payer: Medicare Other | Admitting: Family Medicine

## 2023-08-07 VITALS — BP 124/86 | HR 60 | Temp 97.7°F | Ht 64.0 in | Wt 252.4 lb

## 2023-08-07 DIAGNOSIS — N390 Urinary tract infection, site not specified: Secondary | ICD-10-CM | POA: Diagnosis not present

## 2023-08-07 DIAGNOSIS — I10 Essential (primary) hypertension: Secondary | ICD-10-CM

## 2023-08-07 DIAGNOSIS — N02B9 Other recurrent and persistent immunoglobulin A nephropathy: Secondary | ICD-10-CM

## 2023-08-07 LAB — COMPREHENSIVE METABOLIC PANEL
ALT: 13 U/L (ref 0–35)
AST: 18 U/L (ref 0–37)
Albumin: 3.9 g/dL (ref 3.5–5.2)
Alkaline Phosphatase: 54 U/L (ref 39–117)
BUN: 10 mg/dL (ref 6–23)
CO2: 27 mEq/L (ref 19–32)
Calcium: 9.6 mg/dL (ref 8.4–10.5)
Chloride: 101 mEq/L (ref 96–112)
Creatinine, Ser: 0.7 mg/dL (ref 0.40–1.20)
GFR: 87.34 mL/min (ref >60.00)
Glucose, Bld: 98 mg/dL (ref 70–99)
Potassium: 4.1 mEq/L (ref 3.5–5.1)
Sodium: 137 mEq/L (ref 135–145)
Total Bilirubin: 0.9 mg/dL (ref 0.2–1.2)
Total Protein: 7.3 g/dL (ref 6.0–8.3)

## 2023-08-07 LAB — CBC
HCT: 44.3 % (ref 36.0–46.0)
Hemoglobin: 14.6 g/dL (ref 12.0–15.0)
MCHC: 32.9 g/dL (ref 30.0–36.0)
MCV: 90.9 fl (ref 78.0–100.0)
Platelets: 288 10*3/uL (ref 150.0–400.0)
RBC: 4.87 Mil/uL (ref 3.87–5.11)
RDW: 13.2 % (ref 11.5–15.5)
WBC: 6.2 10*3/uL (ref 4.0–10.5)

## 2023-08-07 LAB — POCT URINALYSIS DIPSTICK
Bilirubin, UA: NEGATIVE
Blood, UA: NEGATIVE
Glucose, UA: NEGATIVE
Ketones, UA: NEGATIVE
Leukocytes, UA: NEGATIVE
Nitrite, UA: NEGATIVE
Protein, UA: NEGATIVE
Spec Grav, UA: 1.01 (ref 1.010–1.025)
Urobilinogen, UA: 0.2 E.U./dL
pH, UA: 6.5 (ref 5.0–8.0)

## 2023-08-07 NOTE — Patient Instructions (Addendum)
It was very nice to see you today!  Will check blood work today to check your kidneys.  Please continue with your current blood pressure medications.  That is okay for you to get the COVID shot.  We will check a urine culture make sure that you do not have a UTI.  Return if symptoms worsen or fail to improve.   Take care, Dr Jimmey Ralph  PLEASE NOTE:  If you had any lab tests, please let us know if you have not heard back within a few days. You may see your results on mychart before we have a chance to review them but we will give you a call once they are reviewed by [place].   If we ordered any referrals today, please let us know if you have not heard from their office within the next week.   If you had any urgent prescriptions sent in today, please check with the pharmacy within an hour of our visit to make sure the prescription was transmitted appropriately.   Please try these tips to maintain a healthy lifestyle:  Eat at least 3 REAL meals and 1-2 snacks per day.  Aim for no more than 5 hours between eating.  If you eat breakfast, please do so within one hour of getting up.   Each meal should contain half fruits/vegetables, one quarter protein, and one quarter carbs (no bigger than a computer mouse)  Cut down on sweet beverages. This includes juice, soda, and sweet tea.   Drink at least 1 glass of water with each meal and aim for at least 8 glasses per day  Exercise at least 150 minutes every week.

## 2023-08-07 NOTE — Progress Notes (Signed)
[name] is a 72 y.o. female who presents today for an office visit.  Assessment/Plan:  New/Acute Problems: Urinary frequency Normal UA today.  Will send out for urine culture.  Symptoms are improving.  No red flag signs or symptoms.  We discussed reasons to return to care and seek emergent care.  Will not treat unless her culture is positive.  Adverse reaction to flu vaccine Patient with more myalgias, tachycardia, and elevated blood pressure reading since having flu vaccine last week.  She had something similar happened after flu vaccine a year ago as well.  It is possible that she could be having a reaction to some component of flu vaccine.  For now we will be managing her blood pressure as below however make sure would consider egg free or alternative version of flu shot.   Chronic Problems Addressed Today: Hypertension At goal today though was elevated over the last couple of days.  May be due to a reaction that she is having to flu vaccine as she had something similar happen last year.  She will continue her current regimen of Bystolic 5 mg daily, Procardia 60 mg daily, HCTZ 12.5 mg daily, benazepril 40 mg daily.  She will continue to monitor at home.  Advised her to follow-up with nephrology soon who is managing her blood pressure medications.  Will check labs today per patient request today.  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  Otherwise continue management per nephrology.    Subjective:  HPI:  See Assessment / plan for status of chronic conditions.  Patient here today with her husband.  Primary concern today is elevated blood pressure reading and heart rate.  States that she got her flu shot last week.  Shortly after started developing higher blood pressure readings and higher heart rate.  She has all readings as high as the 170s to 180s.  She doubled up her dose of nifedipine to 60 mg daily.  Continued her previous dose of nebivolol 5 mg daily, HCTZ 12.5 mg daily,  and benazepril 40 mg daily.  Blood pressure gradually improved over the next couple of days.  She is also been having some mid upper back pain.  Been using heating pad to the area but this type seem to be getting better.  She would like to make sure that her kidneys are okay.  She does follow with nephrology and will be seeing them again in a few weeks.  She does note that she had a similar reaction last year after getting COVID and flu vaccine on the same day.  At that time had increase her dose of nebivolol for a few days.  She is also concerned about possible urinary tract infection. She did check a urine test at home that was positive for leukocytes. She is having more frequency. Some chills. No fevers. No dysuria. Some decreased appetite. Some more swelling in joints. Symptoms seem to be getting better.        Objective:  Physical Exam: BP 124/86 Comment: at 7:30 at home today  Pulse 60   Temp 97.7 F (36.5 C) (Temporal)   Ht 5\' 4"  (1.626 m)   Wt 252 lb 6.4 oz (114.5 kg)   LMP  (LMP Unknown)   SpO2 98%   BMI 43.32 kg/m   Gen: No acute distress, resting comfortably CV: Regular rate and rhythm with no murmurs appreciated Pulm: Normal work of breathing, clear to auscultation bilaterally with no crackles, wheezes, or rhonchi Neuro: Grossly normal,  moves all extremities Psych: Normal affect and thought content      Renette Hsu M. Jimmey Ralph, MD 08/07/2023 11:38 AM

## 2023-08-07 NOTE — Assessment & Plan Note (Signed)
At goal today though was elevated over the last couple of days.  May be due to a reaction that she is having to flu vaccine as she had something similar happen last year.  She will continue her current regimen of Bystolic 5 mg daily, Procardia 60 mg daily, HCTZ 12.5 mg daily, benazepril 40 mg daily.  She will continue to monitor at home.  Advised her to follow-up with nephrology soon who is managing her blood pressure medications.  Will check labs today per patient request today.

## 2023-08-07 NOTE — Assessment & Plan Note (Signed)
Check labs today.  Otherwise continue management per nephrology.

## 2023-08-08 LAB — URINE CULTURE
MICRO NUMBER:: 15502379
Result:: NO GROWTH
SPECIMEN QUALITY:: ADEQUATE

## 2023-08-10 NOTE — Progress Notes (Signed)
Her urine culture is negative.  No signs of UTI.  Blood counts normal.  Kidney function is normal.Did not need to make any changes to her treatment plan at this time.  She does not need any antibiotics.  She should let us know if her symptoms are not improving.

## 2023-08-31 ENCOUNTER — Encounter: Payer: Self-pay | Admitting: Family Medicine

## 2023-08-31 LAB — LAB REPORT - SCANNED: Creatinine, POC: 23.5 mg/dL

## 2024-01-02 LAB — PROTEIN / CREATININE RATIO, URINE: Creatinine, Urine: 36.8

## 2024-01-02 LAB — CBC AND DIFFERENTIAL
HCT: 45 (ref 36–46)
Hemoglobin: 15.5 (ref 12.0–16.0)
Neutrophils Absolute: 6.5
Platelets: 276 10*3/uL (ref 150–400)
WBC: 9.6

## 2024-01-02 LAB — VITAMIN D 25 HYDROXY (VIT D DEFICIENCY, FRACTURES): Vit D, 25-Hydroxy: 39.7

## 2024-01-02 LAB — CBC: RBC: 5.04 (ref 3.87–5.11)

## 2024-01-05 LAB — LAB REPORT - SCANNED
Calcium: 10.3
Creatinine, POC: 36.8 mg/dL
EGFR: 94
PTH: 36

## 2024-01-09 ENCOUNTER — Encounter: Payer: Self-pay | Admitting: Family Medicine

## 2024-04-30 LAB — LAB REPORT - SCANNED
Creatinine, POC: 37.3 mg/dL
EGFR: 95

## 2024-05-09 ENCOUNTER — Ambulatory Visit: Payer: Medicare Other

## 2024-05-09 VITALS — Ht 63.5 in | Wt 252.0 lb

## 2024-05-09 DIAGNOSIS — Z0000 Encounter for general adult medical examination without abnormal findings: Secondary | ICD-10-CM

## 2024-05-09 NOTE — Progress Notes (Signed)
 Subjective:   [name] is a 73 y.o. who presents for a Medicare Wellness preventive visit.  As a reminder, Annual Wellness Visits don't include a physical exam, and some assessments may be limited, especially if this visit is performed virtually. We may recommend an in-person follow-up visit with your provider if needed.  Visit Complete: Virtual I connected with  [name] on 05/09/24 by a audio enabled telemedicine application and verified that I am speaking with the correct person using two identifiers.  Patient Location: Home  Provider Location: Office/Clinic  I discussed the limitations of evaluation and management by telemedicine. The patient expressed understanding and agreed to proceed.  Vital Signs: Because this visit was a virtual/telehealth visit, some criteria may be missing or patient reported. Any vitals not documented were not able to be obtained and vitals that have been documented are patient reported.  VideoDeclined- This patient declined Librarian, academic. Therefore the visit was completed with audio only.  Persons Participating in Visit: Patient.  AWV Questionnaire: No: Patient Medicare AWV questionnaire was not completed prior to this visit.  Cardiac Risk Factors include: advanced age (>24men, >13 women);hypertension;obesity (BMI >30kg/m2)     Objective:    Today's Vitals   05/09/24 1417 05/09/24 1418  Weight: 252 lb (114.3 kg)   Height: 5' 3.5 (1.613 m)   PainSc:  4    Body mass index is 43.94 kg/m.     05/09/2024    2:25 PM 05/04/2023    3:10 PM 04/28/2022    3:09 PM 11/26/2020    2:39 PM 10/14/2019    4:08 PM 01/21/2015    1:06 PM 01/04/2013    3:34 AM  Advanced Directives  Does Patient Have a Medical Advance Directive? Yes Yes Yes Yes No No  Patient has advance directive, copy not in chart   Type of Advance Directive Healthcare Power of [place];Living will Healthcare Power of [place];Living will  Healthcare Power of [employer] Power of Attorney   Living will   Does patient want to make changes to medical advance directive? No - Patient declined No - Patient declined       Copy of Healthcare Power of Attorney in Chart? Yes - validated most recent copy scanned in chart (See row information) Yes - validated most recent copy scanned in chart (See row information) Yes - validated most recent copy scanned in chart (See row information) Yes - validated most recent copy scanned in chart (See row information)   Copy requested from family   Would patient like information on creating a medical advance directive?     Yes (MAU/Ambulatory/Procedural Areas - Information given) No - patient declined information       Data saved with a previous flowsheet row definition    Current Medications (verified) Outpatient Encounter Medications as of 05/09/2024  Medication Sig   benazepril  (LOTENSIN ) 40 MG tablet Take 40 mg by mouth daily.   Coenzyme Q10 (COQ-10) 200 MG CAPS Take 1 capsule by mouth daily.   Cranberry 1000 MG CAPS    glucosamine-chondroitin 500-400 MG tablet Take 2 tablets by mouth 3 (three) times daily.   hydrochlorothiazide  (HYDRODIURIL ) 12.5 MG tablet Take 12.5 mg by mouth daily as needed.   LORazepam  (ATIVAN ) 1 MG tablet Take 1 tablet (1 mg total) by mouth every 8 (eight) hours as needed for anxiety. TAKE ONE TABLET BY MOUTH EVERY 8 TO 12 HOURS AS NEEDED   losartan  (COZAAR ) 100 MG tablet Take 100 mg by mouth  daily.   Multiple Vitamins-Minerals (ZINC PO) Take by mouth.   nebivolol  (BYSTOLIC ) 5 MG tablet    NIFEdipine  (ADALAT  CC) 30 MG 24 hr tablet    Omega-3 Fatty Acids (FISH OIL) 1000 MG CAPS Take 6,000 mg by mouth in the morning and at bedtime.   OVER THE COUNTER MEDICATION Take 1,000 mg by mouth in the morning and at bedtime. Tumeric   vitamin E  400 UNIT capsule Take 400 Units by mouth daily.   [DISCONTINUED] b complex vitamins tablet Take 1 tablet by mouth daily.   No  facility-administered encounter medications on file as of 05/09/2024.    Allergies (verified) Clonidine  derivatives, Flagyl [metronidazole], Hydralazine , Statins, and Sulfa antibiotics   History: Past Medical History:  Diagnosis Date   Anxiety    Arthritis    painful joints (01/21/2015)   GERD (gastroesophageal reflux disease)    Headache    at least q other day lately (01/21/2015)   Heart murmur    Hepatitis B ~ 1992   History of gout    Hypertension    IgAN (IgA nephropathy)    Migraine X 1   Rheumatic fever ~ 1967   Past Surgical History:  Procedure Laterality Date   DILATION AND CURETTAGE OF UTERUS  ~ 1965   ERCP  ~ 1993   LAPAROSCOPIC CHOLECYSTECTOMY  ~ 1983   Family History  Problem Relation Age of Onset   COPD Mother    Kidney disease Father    Heart Problems Brother    Hypertension Brother    Hyperlipidemia Brother    Other Maternal Grandmother        natural causes   Stroke Maternal Grandfather    Other Paternal Grandmother        natural causes   Cancer Paternal Grandfather    Coronary artery disease Other        Half brother   Social History   Socioeconomic History   Marital status: Married    Spouse name: Not on file   Number of children: 0   Years of education: Not on file   Highest education level: 12th grade  Occupational History   Occupation: retired  Tobacco Use   Smoking status: Never   Smokeless tobacco: Never  Substance and Sexual Activity   Alcohol use: Not Currently    Comment: 01/21/2015 1, 12oz beer/month; if that   Drug use: No   Sexual activity: Yes  Other Topics Concern   Not on file  Social History Narrative   Not on file   Social Drivers of Health   Financial Resource Strain: Low Risk  (05/09/2024)   Overall Financial Resource Strain (CARDIA)    Difficulty of Paying Living Expenses: Not hard at all  Food Insecurity: No Food Insecurity (05/09/2024)   Hunger Vital Sign    Worried About Running Out of Food in the Last  Year: Never true    Ran Out of Food in the Last Year: Never true  Transportation Needs: No Transportation Needs (05/09/2024)   PRAPARE - Administrator, Civil Service (Medical): No    Lack of Transportation (Non-Medical): No  Physical Activity: Insufficiently Active (05/09/2024)   Exercise Vital Sign    Days of Exercise per Week: 3 days    Minutes of Exercise per Session: 20 min  Stress: No Stress Concern Present (05/09/2024)   [employer] of Occupational Health - Occupational Stress Questionnaire    Feeling of Stress: Only a little  Recent Concern: Stress -  Stress Concern Present (05/02/2024)   [employer] of Occupational Health - Occupational Stress Questionnaire    Feeling of Stress: Rather much  Social Connections: Socially Isolated (05/09/2024)   Social Connection and Isolation Panel    Frequency of Communication with Friends and Family: Once a week    Frequency of Social Gatherings with Friends and Family: Once a week    Attends Religious Services: Patient declined    Database administrator or Organizations: No    Attends Engineer, structural: Not on file    Marital Status: Married    Tobacco Counseling Counseling given: Not Answered    Clinical Intake:  Pre-visit preparation completed: Yes  Pain : 0-10 Pain Score: 4  Pain Type: Chronic pain Pain Location: Knee Pain Descriptors / Indicators: Aching Pain Onset: More than a month ago Pain Frequency: Intermittent     BMI - recorded: 43.94 Nutritional Status: BMI > 30  Obese Diabetes: No  Lab Results  Component Value Date   HGBA1C 5.7 12/18/2019   HGBA1C 5.8 (H) 01/04/2013     How often do you need to have someone help you when you read instructions, pamphlets, or other written materials from your doctor or pharmacy?: 1 - Never  Interpreter Needed?: No  Information entered by :: Ellouise Haws, LPN   Activities of Daily Living     05/09/2024    2:20 PM  In your present  state of health, do you have any difficulty performing the following activities:  Hearing? 0  Vision? 0  Difficulty concentrating or making decisions? 0  Walking or climbing stairs? 1  Comment avoid as much as possible  Dressing or bathing? 0  Doing errands, shopping? 0  Preparing Food and eating ? N  Using the Toilet? N  In the past six months, have you accidently leaked urine? N  Do you have problems with loss of bowel control? N  Managing your Medications? N  Managing your Finances? N  Housekeeping or managing your Housekeeping? N    Patient Care Team: Kennyth Worth HERO, MD as PCP - General (Family Medicine) Linnell Devere BRAVO, MD as Consulting Physician (Obstetrics and Gynecology) Rayburn Pac, MD as Consulting Physician (Nephrology)  I have updated your Care Teams any recent Medical Services you may have received from other providers in the past year.     Assessment:   This is a routine wellness examination for [name].  Hearing/Vision screen Hearing Screening - Comments:: Pt denies any hearing issues  Vision Screening - Comments:: Wears rx glasses - up to date with routine eye exams with [facility] in [place]     Goals Addressed   None    Depression Screen      05/09/2024    2:28 PM 08/07/2023   11:07 AM 05/04/2023    3:09 PM 04/28/2022    3:08 PM 01/31/2022    1:41 PM 01/31/2022    1:00 PM 11/26/2020    2:38 PM  PHQ 2/9 Scores  PHQ - 2 Score 0 0 0 0 0 0 0  PHQ- 9 Score  3   2      Fall Risk      05/09/2024    2:26 PM 08/07/2023   11:08 AM 05/03/2023    9:48 PM 04/28/2022    3:10 PM 01/31/2022    1:00 PM  Fall Risk   Falls in the past year?  0 0 1 1  Number falls in past yr: 1 0  1 0  Injury with Fall? 0 0  1 0  Comment    left knee injury   Risk for fall due to : History of fall(s);Impaired mobility No Fall Risks Impaired vision;Impaired balance/gait;Impaired mobility Impaired vision No Fall Risks  Follow up Falls prevention discussed   Falls prevention discussed Falls prevention discussed  Falls evaluation completed      Data saved with a previous flowsheet row definition    MEDICARE RISK AT HOME:   Medicare Risk at Home Any stairs in or around the home?: Yes If so, are there any without handrails?: No Home free of loose throw rugs in walkways, pet beds, electrical cords, etc?: Yes Adequate lighting in your home to reduce risk of falls?: Yes Life alert?: No Use of a cane, walker or w/c?: Yes Grab bars in the bathroom?: Yes Shower chair or bench in shower?: No Elevated toilet seat or a handicapped toilet?: No  TIMED UP AND GO:  Was the test performed?  No  Cognitive Function: 6CIT completed        05/09/2024    2:28 PM 05/04/2023    3:13 PM 04/28/2022    3:24 PM 11/26/2020    2:43 PM  6CIT Screen  What Year? 0 points 0 points 0 points 0 points  What month? 0 points 0 points 0 points 0 points  What time? 0 points 0 points 0 points   Count back from 20 0 points 0 points 0 points 0 points  Months in reverse 0 points 0 points 0 points 0 points  Repeat phrase 0 points 0 points 0 points 2 points  Total Score 0 points 0 points 0 points     Immunizations Immunization History  Administered Date(s) Administered   Fluad Quad(high Dose 65+) 08/03/2022   Influenza, High Dose Seasonal PF 07/30/2021   Influenza-Unspecified 08/02/2020, 08/01/2023   Moderna Covid-19 Fall Seasonal Vaccine 96yrs & older 08/16/2023   Moderna Covid-19 Vaccine Bivalent Booster 82yrs & up 07/29/2021, 08/22/2022   Moderna Sars-Covid-2 Vaccination 03/30/2020, 04/27/2020, 11/09/2020, 03/12/2021   PNEUMOCOCCAL CONJUGATE-20 01/31/2022   Tdap 02/11/2022    Screening Tests Health Maintenance  Topic Date Due   Zoster Vaccines- Shingrix (1 of 2) Never done   MAMMOGRAM  03/09/2023   INFLUENZA VACCINE  06/14/2024   Fecal DNA (Cologuard)  02/09/2025   Medicare Annual Wellness (AWV)  05/09/2025   DTaP/Tdap/Td (2 - Td or Tdap) 02/12/2032    Pneumococcal Vaccine: 50+ Years  Completed   DEXA SCAN  Completed   Hepatitis C Screening  Completed   Hepatitis B Vaccines  Aged Out   HPV VACCINES  Aged Out   Meningococcal B Vaccine  Aged Out   COVID-19 Vaccine  Discontinued    Health Maintenance  Health Maintenance Due  Topic Date Due   Zoster Vaccines- Shingrix (1 of 2) Never done   MAMMOGRAM  03/09/2023   Health Maintenance Items Addressed: See Nurse Notes at the end of this note  Additional Screening:  Vision Screening: Recommended annual ophthalmology exams for early detection of glaucoma and other disorders of the eye. Would you like a referral to an eye doctor? No    Dental Screening: Recommended annual dental exams for proper oral hygiene  Community Resource Referral / Chronic Care Management: CRR required this visit?  No   CCM required this visit?  No   Plan:    I have personally reviewed and noted the following in the patient's chart:   Medical and social history  Use of alcohol, tobacco or illicit drugs  Current medications and supplements including opioid prescriptions. Patient is not currently taking opioid prescriptions. Functional ability and status Nutritional status Physical activity Advanced directives List of other physicians Hospitalizations, surgeries, and ER visits in previous 12 months Vitals Screenings to include cognitive, depression, and falls Referrals and appointments  In addition, I have reviewed and discussed with patient certain preventive protocols, quality metrics, and best practice recommendations. A written personalized care plan for preventive services as well as general preventive health recommendations were provided to patient.   Ellouise VEAR Haws, LPN   [id number]   After Visit Summary: (MyChart) Due to this being a telephonic visit, the after visit summary with patients personalized plan was offered to patient via MyChart   Notes: Nothing significant to report at this  time. Pt declined mammogram information provided for future reference

## 2024-05-09 NOTE — Patient Instructions (Signed)
 Ms. Osterhout , Thank you for taking time out of your busy schedule to complete your Annual Wellness Visit with me. I enjoyed our conversation and look forward to speaking with you again next year. I, as well as your care team,  appreciate your ongoing commitment to your health goals. Please review the following plan we discussed and let me know if I can assist you in the future. Your Game plan/ To Do List    Referrals: If you haven't heard from the office you've been referred to, please reach out to them at the phone provided.   Follow up Visits: Next Medicare AWV with our clinical staff: 05/19/25   Have you seen your provider in the last 6 months (3 months if uncontrolled diabetes)? No Next Office Visit with your provider: pt will call for appt   Clinician Recommendations:  Aim for 30 minutes of exercise or brisk walking, 6-8 glasses of water, and 5 servings of fruits and vegetables each day.       This is a list of the screening recommended for you and due dates:  Health Maintenance  Topic Date Due   Zoster (Shingles) Vaccine (1 of 2) Never done   Mammogram  03/09/2023   Flu Shot  06/14/2024   Cologuard (Stool DNA test)  02/09/2025   Medicare Annual Wellness Visit  05/09/2025   DTaP/Tdap/Td vaccine (2 - Td or Tdap) 02/12/2032   Pneumococcal Vaccine for age over 38  Completed   DEXA scan (bone density measurement)  Completed   Hepatitis C Screening  Completed   Hepatitis B Vaccine  Aged Out   HPV Vaccine  Aged Out   Meningitis B Vaccine  Aged Out   COVID-19 Vaccine  Discontinued    Advanced directives: (In Chart) A copy of your advanced directives are scanned into your chart should your provider ever need it. Advance Care Planning is important because it:  [x]  Makes sure you receive the medical care that is consistent with your values, goals, and preferences  [x]  It provides guidance to your family and loved ones and reduces their decisional burden about whether or not they are  making the right decisions based on your wishes.  Follow the link provided in your after visit summary or read over the paperwork we have mailed to you to help you started getting your Advance Directives in place. If you need assistance in completing these, please reach out to us  so that we can help you!  See attachments for Preventive Care and Fall Prevention Tips.     Please call for appointment: If you want any further information or testing for mammogram  [facility] [address] [place], [place] [address] [phone]  [facility] [address] [place], [place] [address] [phone]  [facility] [address] [place], [place] [address] [phone]  [facility] [address] [place], [place] [address] [phone]  [facility] [address] [place], [place] [address] [phone]

## 2024-08-01 ENCOUNTER — Encounter: Payer: Self-pay | Admitting: Family Medicine

## 2024-08-05 MED ORDER — LORAZEPAM 1 MG PO TABS: 1.0000 mg | ORAL_TABLET | Freq: Three times a day (TID) | ORAL | 3 refills | Status: AC | PRN

## 2024-09-09 LAB — BASIC METABOLIC PANEL WITH GFR
BUN: 11 (ref 4–21)
CO2: 27 — AB (ref 13–22)
Chloride: 102 (ref 99–108)
Creatinine: 0.8 (ref 0.5–1.1)
Glucose: 94
Potassium: 3.9 meq/L (ref 3.5–5.1)
Sodium: 140 (ref 137–147)

## 2024-09-09 LAB — COMPREHENSIVE METABOLIC PANEL WITH GFR
Albumin: 4.3 (ref 3.5–5.0)
Calcium: 10 (ref 8.7–10.7)
eGFR: 85

## 2024-09-09 LAB — CBC AND DIFFERENTIAL
HCT: 44 (ref 36–46)
Hemoglobin: 14.8 (ref 12.0–16.0)
Neutrophils Absolute: 6.1
Platelets: 265 K/uL (ref 150–400)
WBC: 8.9

## 2024-09-09 LAB — LAB REPORT - SCANNED
Albumin, Urine POC: 48.5
Creatinine, POC: 15 mg/dL
EGFR: 85
Microalb Creat Ratio: 323

## 2024-09-09 LAB — PROTEIN / CREATININE RATIO, URINE
Albumin, U: 48.5
Creatinine, Urine: 15

## 2024-09-09 LAB — CBC: RBC: 4.89 (ref 3.87–5.11)

## 2024-09-09 LAB — VITAMIN D 25 HYDROXY (VIT D DEFICIENCY, FRACTURES): Vit D, 25-Hydroxy: 43

## 2024-09-09 LAB — MICROALBUMIN / CREATININE URINE RATIO: Microalb Creat Ratio: 323

## 2024-09-16 ENCOUNTER — Other Ambulatory Visit: Payer: Self-pay | Admitting: Family Medicine

## 2024-09-22 ENCOUNTER — Encounter: Payer: Self-pay | Admitting: Family Medicine

## 2024-09-25 ENCOUNTER — Other Ambulatory Visit: Payer: Self-pay | Admitting: Family Medicine

## 2024-09-25 ENCOUNTER — Encounter: Payer: Self-pay | Admitting: Family Medicine

## 2024-09-25 ENCOUNTER — Ambulatory Visit (INDEPENDENT_AMBULATORY_CARE_PROVIDER_SITE_OTHER): Admitting: Family Medicine

## 2024-09-25 VITALS — BP 154/100 | HR 73 | Temp 97.5°F | Ht 63.5 in | Wt 250.0 lb

## 2024-09-25 DIAGNOSIS — I10 Essential (primary) hypertension: Secondary | ICD-10-CM | POA: Diagnosis not present

## 2024-09-25 DIAGNOSIS — M549 Dorsalgia, unspecified: Secondary | ICD-10-CM

## 2024-09-25 DIAGNOSIS — W19XXXA Unspecified fall, initial encounter: Secondary | ICD-10-CM | POA: Diagnosis not present

## 2024-09-25 DIAGNOSIS — Z6841 Body Mass Index (BMI) 40.0 and over, adult: Secondary | ICD-10-CM

## 2024-09-25 MED ORDER — ZEPBOUND 2.5 MG/0.5ML ~~LOC~~ SOAJ: 2.5000 mg | SUBCUTANEOUS | 0 refills | Status: DC

## 2024-09-25 NOTE — Assessment & Plan Note (Signed)
 BMI 43.59 with comorbidities.  She would be a good candidate for GLP agonist though unsure about insurance coverage.  Will try Zepbound 2.5 mg weekly.  We discussed potential side effects.  She will follow-up with us  in a few weeks via MyChart and we can adjust as needed.

## 2024-09-25 NOTE — Telephone Encounter (Signed)
 Information placed on referral.

## 2024-09-25 NOTE — Addendum Note (Signed)
 Addended by: IDA ELORA HERO on: 09/25/2024 03:40 PM   Modules accepted: Orders

## 2024-09-25 NOTE — Telephone Encounter (Signed)
Please start PA thanks.

## 2024-09-25 NOTE — Addendum Note (Signed)
 Addended by: IDA ELORA HERO on: 09/25/2024 02:45 PM   Modules accepted: Orders

## 2024-09-25 NOTE — Patient Instructions (Signed)
 It was very nice to see you today!  VISIT SUMMARY: During your visit, we discussed your recent fall, ongoing knee and back pain, and other health concerns. We have outlined a plan to manage your pain, improve your mobility, and address your overall health.  YOUR PLAN: FALL WITH LEFT KNEE INJURY AND ACUTE BACK STRAIN: You experienced a fall that resulted in a left knee injury and acute back strain. The pain is primarily in your back due to muscle strain. -Continue taking Tylenol  for pain management. -Use a heating pad alternating with ice for back pain. -Monitor your symptoms and contact us  if your condition worsens. -You are referred to physical therapy for strengthening exercises and fall prevention.  CHRONIC BILATERAL KNEE PAIN AND DYSFUNCTION: You have ongoing knee pain and instability, particularly in your right knee. -Physical therapy is recommended for knee strengthening and dysfunction management. -Consider an orthopedic consultation if physical therapy is not effective.  CHRONIC LOW BACK PAIN WITH MUSCLE SPASM: You have chronic low back pain with muscle spasms due to muscular strain. -Continue taking Tylenol  for pain management. -Use a heating pad alternating with ice for back pain. -Monitor your symptoms and contact us  if your condition worsens.  MORBID OBESITY DUE TO EXCESS CALORIES: Your weight is contributing to knee pain and high blood pressure. -We discussed GLP-1 agonist therapy for weight loss and blood pressure management. -A prescription for GLP-1 agonist (Zepbound) was sent to the pharmacy for insurance evaluation. -Discuss insurance coverage and cost with the pharmacy, and consider therapy if insurance approves and cost is manageable.  ESSENTIAL HYPERTENSION: Your high blood pressure needs to be managed, especially due to your weight and knee pain. -The GLP-1 agonist may help control your blood pressure through weight loss.  Return if symptoms worsen or fail to  improve.   Take care, Dr Kennyth  PLEASE NOTE:  If you had any lab tests, please let us  know if you have not heard back within a few days. You may see your results on mychart before we have a chance to review them but we will give you a call once they are reviewed by us .   If we ordered any referrals today, please let us  know if you have not heard from their office within the next week.   If you had any urgent prescriptions sent in today, please check with the pharmacy within an hour of our visit to make sure the prescription was transmitted appropriately.   Please try these tips to maintain a healthy lifestyle:  Eat at least 3 REAL meals and 1-2 snacks per day.  Aim for no more than 5 hours between eating.  If you eat breakfast, please do so within one hour of getting up.   Each meal should contain half fruits/vegetables, one quarter protein, and one quarter carbs (no bigger than a computer mouse)  Cut down on sweet beverages. This includes juice, soda, and sweet tea.   Drink at least 1 glass of water with each meal and aim for at least 8 glasses per day  Exercise at least 150 minutes every week.

## 2024-09-25 NOTE — Assessment & Plan Note (Signed)
 Mildly elevated though typically well-controlled on her regimen of benazepril  40 mg daily, Procardia  60 mg daily and Bystolic  5 mg per nephrology.  She will monitor at home andfollow-up with us  persistently elevated.

## 2024-09-25 NOTE — Telephone Encounter (Signed)
 Information given to our referral coordinator Olam Advise to call in for appt in a few days  Patient verbalized understanding

## 2024-09-25 NOTE — Progress Notes (Signed)
 [name] is a 73 y.o. female who presents today for an office visit.  Assessment/Plan:  New/Acute Problems: Fall  Patient with fall 5 days ago and another fall last year.  Feels like she may have lost balance.  She does have some mild back pain which is currently manageable.  Due to recurrent nature of falls would be reasonable for her to have a round of physical therapy to work on gait training and strengthening exercises.  She is agreeable to this.  Will place referral today.  Back pain No red flags.  May be muscular strain.  Symptoms have improved significantly the last few days.  We did discuss trial of muscle relaxer and prednisone  however she declined.  Also discussed checking an x-ray today however given her improvement she would like to hold off on this for now as well.  Knee pain This has worsened since her most recent fall.  Likely does have some underlying degenerative changes.  Discussed imaging as above however will defer for today.  We are trying physical therapy as above.  Depending on response to this may need referral for her to see orthopedics.  Also working on weight loss as below.  Chronic Problems Addressed Today: Obesity, morbid (HCC) BMI 43.59 with comorbidities.  She would be a good candidate for GLP agonist though unsure about insurance coverage.  Will try Zepbound 2.5 mg weekly.  We discussed potential side effects.  She will follow-up with us  in a few weeks via MyChart and we can adjust as needed.  Hypertension Mildly elevated though typically well-controlled on her regimen of benazepril  40 mg daily, Procardia  60 mg daily and Bystolic  5 mg per nephrology.  She will monitor at home andfollow-up with us  persistently elevated.     Subjective:  HPI:  See assessment / plan for status of chronic conditions.   Discussed the use of AI scribe software for clinical note transcription with the patient, who gave verbal consent to proceed.  History of Present  Illness [name] is a 73 year old female who presents with a recent fall and ongoing knee and back pain.  She experienced a fall five days ago while walking into a restaurant when her left knee gave out, causing her to land on her left side and knee. Assistance was required to get up, involving a chair and help from others. She has a history of previous falls, with incidents occurring about a year ago beside her bed and two years ago in a hallway.  She is experiencing significant pain primarily in her lower back. The pain is exacerbated by physical activity. She has been using Tylenol  for pain relief, which has been somewhat effective, and she reports improvement since the initial incident. She has avoided using ibuprofen due to kidney concerns.  She reports issues with her knees, particularly the right knee, which has been swelling and causing pain. Her knees have been problematic for over a year, with the left knee occasionally giving out. She previously had an x-ray and was informed of some problems but has not pursued further treatment since then.  No numbness or tingling in her legs.         Objective:  Physical Exam: BP (!) 154/100   Pulse 73   Temp (!) 97.5 F (36.4 C) (Temporal)   Ht 5' 3.5 (1.613 m)   Wt 250 lb (113.4 kg)   LMP  (LMP Unknown)   SpO2 99%   BMI 43.59 kg/m  Wt Readings from Last 3 Encounters:  09/25/24 250 lb (113.4 kg)  05/09/24 252 lb (114.3 kg)  08/07/23 252 lb 6.4 oz (114.5 kg)    Gen: No acute distress, resting comfortably CV: Regular rate and rhythm with no murmurs appreciated Pulm: Normal work of breathing, clear to auscultation bilaterally with no crackles, wheezes, or rhonchi MUSCULOSKELETAL: Back without deformities.  Tenderness palpation along bilateral lower lumbar paraspinal muscle groups.  No midline tenderness Neuro: Grossly normal, moves all extremities Psych: Normal affect and thought content      Darrien Belter M. Kennyth,  MD 09/25/2024 10:37 AM

## 2024-09-26 ENCOUNTER — Other Ambulatory Visit (HOSPITAL_COMMUNITY): Payer: Self-pay

## 2024-09-26 ENCOUNTER — Telehealth: Payer: Self-pay

## 2024-09-26 NOTE — Telephone Encounter (Signed)
 Pharmacy Patient Advocate Encounter   Received notification from RX Request Messages that prior authorization for Zepbound 2.5MG /0.5ML Auto-injectors is required/requested.   Insurance verification completed.   The patient is insured through [employer].   Per test claim: PA required; PA submitted to above mentioned insurance via Latent Key/confirmation #/EOC [id number] Status is pending

## 2024-09-26 NOTE — Telephone Encounter (Signed)
 Pharmacy Patient Advocate Encounter  Received notification from [employer] that Prior Authorization for Zepbound 2.5MG /0.5ML Auto-injectors  has been DENIED.  Full denial letter will be uploaded to the media tab. See denial reason below.   PA #/Case ID/Reference #: [id number]

## 2024-09-27 NOTE — Telephone Encounter (Signed)
 Please review note from PA team and advise on next steps.  Thank you!

## 2024-09-30 NOTE — Telephone Encounter (Signed)
 Patient notified RxPrior Authorization for Zepbound 2.5MG /0.5ML Auto-injectors  has been DENIED.

## 2024-09-30 NOTE — Telephone Encounter (Signed)
 She will have to check with insurance to see if they have any covered alternatives.

## 2024-10-21 ENCOUNTER — Telehealth: Payer: Self-pay

## 2024-10-21 NOTE — Telephone Encounter (Signed)
 Sent to [facility]   Copied from CRM #[id number]. Topic: Referral - Question >> Oct 18, 2024  2:04 PM Brittany M wrote: Reason for CRM: Patient wanting referral for physical therapy sent to [facility] [phone]

## 2024-10-24 ENCOUNTER — Ambulatory Visit: Payer: Self-pay

## 2024-10-24 NOTE — Telephone Encounter (Signed)
°  FYI Only or Action Required?: FYI only for provider: appointment scheduled on 10/25/24.  Patient was last seen in primary care on 09/25/2024 by Kennyth Worth HERO, MD.  Called Nurse Triage reporting Hypertension and Sore Throat.  Symptoms began several days ago.  Interventions attempted: OTC medications:   and Rest, hydration, or home remedies.  Symptoms are: gradually improving.  Triage Disposition: See Physician Within 24 Hours  Patient/caregiver understands and will follow disposition?: Yes Copied from CRM #[id number]. Topic: Clinical - Red Word Triage >> Oct 24, 2024 12:20 PM Suzen RAMAN wrote: Red Word that prompted transfer to Nurse Triage:  bad cough, sore throat with redness in throat, Elevated Bp, and Blood in sputum;requested an appt Reason for Disposition  Coughing up rusty-colored sputum  Answer Assessment - Initial Assessment Questions 1. ONSET: When did the cough begin?      Had the flu beginning on 11/29, is getting better except for sore throat 2. SEVERITY: How bad is the cough today? Did the blood appear after a coughing spell?      Mild, improving 3. SPUTUM: Describe the color of your sputum (e.g., none, dry cough; clear, white, yellow, green)     This morning 4. HEMOPTYSIS: How much blood? (e.g., flecks, streaks, tablespoons)     Pink tinged sputum this morning x one 5. DIFFICULTY BREATHING: Are you having difficulty breathing? If Yes, ask: How bad is it? (e.g., mild, moderate, severe)      denies 6. FEVER: Do you have a fever? If Yes, ask: What is your temperature, how was it measured, and when did it start?     denies 7. CARDIAC HISTORY: Do you have any history of heart disease? (e.g., heart attack, congestive heart failure)      htn 8. LUNG HISTORY: Do you have any history of lung disease?  (e.g., pulmonary embolus, asthma, emphysema)     denies 9. PE RISK FACTORS: Do you have a history of blood clots? Note: Other risk factors include  recent major surgery, recent prolonged travel, being bedridden.     N/A 10. OTHER SYMPTOMS: Do you have any other symptoms? (e.g., runny nose, wheezing, chest pain)       Cough, sore throat, tired, congestion  Protocols used: Coughing Up Blood-A-AH

## 2024-10-24 NOTE — Telephone Encounter (Signed)
 Call was dropped during warm transfer. Attempted to call pt back but line rang busy then disconnected. Attempt #1   Copied from CRM #[id number]. Topic: Clinical - Red Word Triage >> Oct 24, 2024 12:20 PM [name] wrote: Red Word that prompted transfer to Nurse Triage:  bad cough, sore throat with redness in throat, Elevated Bp, and Blood in sputum;requested an appt

## 2024-10-25 ENCOUNTER — Ambulatory Visit

## 2024-10-25 ENCOUNTER — Ambulatory Visit: Admitting: Family Medicine

## 2024-10-25 ENCOUNTER — Encounter: Payer: Self-pay | Admitting: Family Medicine

## 2024-10-25 VITALS — BP 138/80 | HR 71 | Temp 97.3°F | Ht 63.5 in | Wt 246.2 lb

## 2024-10-25 DIAGNOSIS — R296 Repeated falls: Secondary | ICD-10-CM | POA: Insufficient documentation

## 2024-10-25 DIAGNOSIS — R1031 Right lower quadrant pain: Secondary | ICD-10-CM

## 2024-10-25 DIAGNOSIS — W19XXXA Unspecified fall, initial encounter: Secondary | ICD-10-CM

## 2024-10-25 DIAGNOSIS — I10 Essential (primary) hypertension: Secondary | ICD-10-CM

## 2024-10-25 DIAGNOSIS — J029 Acute pharyngitis, unspecified: Secondary | ICD-10-CM

## 2024-10-25 LAB — URINALYSIS, ROUTINE W REFLEX MICROSCOPIC
Bilirubin Urine: NEGATIVE
Hgb urine dipstick: NEGATIVE
Ketones, ur: NEGATIVE
Leukocytes,Ua: NEGATIVE
Nitrite: NEGATIVE
RBC / HPF: NONE SEEN (ref 0–?)
Specific Gravity, Urine: <=1.005 — AB (ref 1.000–1.030)
Total Protein, Urine: 100 — AB
Urine Glucose: NEGATIVE
Urobilinogen, UA: 0.2 (ref 0.0–1.0)
pH: 6.5 (ref 5.0–8.0)

## 2024-10-25 LAB — POCT RAPID STREP A (OFFICE): Rapid Strep A Screen: POSITIVE — AB

## 2024-10-25 MED ORDER — AMOXICILLIN 500 MG PO CAPS: 500.0000 mg | ORAL_CAPSULE | Freq: Three times a day (TID) | ORAL | 0 refills | Status: AC

## 2024-10-25 NOTE — Progress Notes (Signed)
 [name] is a 73 y.o. female who presents today for an office visit.  Assessment/Plan:  New/Acute Problems: Strep pharyngitis Rapid strep positive.  Overall exam was reassuring today without any signs of systemic illness.  Seems to be recovering from flu normally otherwise.  Will start amoxicillin.  Encouraged hydration.  She can use over-the-counter meds as needed as well.  We discussed reasons to return to care.  Right Hip / Groin Pain  Patient with focal tenderness along right hip since her fall a few weeks ago.  She has been able to walk normally though still does have some lingering tenderness to the area.   Given recent fall we will check plain film today though doubt acute fracture.  May have contusion or bursitis area.  She is also concerned about nephrolithiasis.  This is unlikely however we will check UA today to further evaluate.   Chronic Problems Addressed Today: Recurrent falls We discussed this with her at her most recent visit here a few weeks ago and referred her to PT however she would like to be referred to a physical therapist closer to her house.  Will place this referral today.  She will let us  know if she needs any further assistance.  Hypertension Initially elevated but at goal on recheck.  Continue her regimen per nephrology with benazepril  40 mg daily, Procardia  60 mg daily, and Bystolic  5 mg daily.  She has had some elevations at home recently likely due to recent illness however expect that this will trend back to baseline as she improves.  She will let us  know if she has any persistent elevations at home.     Subjective:  HPI:  See assessment / plan for status of chronic conditions.  Patient is here today for follow-up A[facility]ast saw her a month ago.  At that time we discussed recurrent falls and referred her to see physical therapy.  Her primary concern today is sore throat.  Discussed the use of AI scribe software for clinical note transcription with  the patient, who gave verbal consent to proceed.  History of Present Illness [name] is a 73 year old female who presents with a sore throat and elevated blood pressure.  She has been experiencing a sore throat for a couple of weeks, which she attributes to having had the flu starting on October 12, 2024. Symptoms included coughing and sneezing. She noticed white streaks in her throat, raising her suspicion of strep throat.  No reported fevers or chills.  She reports an unusual elevation in her blood pressure, with readings as high as 155/95 and 172/96, noticed yesterday. The blood pressure started to decrease after taking her medication. She is currently on three different blood pressure medications.  She mentions experiencing pain in her right hip area, which she suspects might be related to a fall or possibly a kidney stone. The pain is localized to a specific area on her hip.  This has been persistent since following a few weeks ago.         Objective:  Physical Exam: BP 138/80   Pulse 71   Temp (!) 97.3 F (36.3 C) (Temporal)   Ht 5' 3.5 (1.613 m)   Wt 246 lb 3.2 oz (111.7 kg)   LMP  (LMP Unknown)   SpO2 96%   BMI 42.93 kg/m   Gen: No acute distress, resting comfortably HEENT: OP erythematous. CV: Regular rate and rhythm with 3/6 systolic murmur appreciated Pulm: Normal work of breathing,  clear to auscultation bilaterally with no crackles, wheezes, or rhonchi Neuro: Grossly normal, moves all extremities Psych: Normal affect and thought content      Ciarah Peace M. Kennyth, MD 10/25/2024 12:41 PM

## 2024-10-25 NOTE — Patient Instructions (Addendum)
 It was very nice to see you today!  VISIT SUMMARY: During your visit, we addressed your sore throat, elevated blood pressure, and hip pain. You were diagnosed with strep throat and prescribed antibiotics. We also discussed your blood pressure management and arranged a referral for physical therapy for your hip pain.  YOUR PLAN: STREPTOCOCCAL PHARYNGITIS: You have a confirmed strep throat infection, which is causing your sore throat and white streaks. -Take amoxicillin as prescribed for 10 days. Complete the full course of antibiotics. -You should start to feel better within 24 to 48 hours.  PRIMARY HYPERTENSION: Your blood pressure is elevated, likely due to the infection. -Continue taking your current blood pressure medications: Benazepril , Procardia , and Bystolic . -Monitor your blood pressure regularly. -Contact our office if your blood pressure does not decrease after your infection clears.  PAIN IN HIP AFTER FALL: You have pain in your hip, which may be due to bursitis, an injury, or possibly a kidney stone or UTI. -A urine sample was checked to rule out a kidney stone or UTI. -You have been referred to physical therapy in [place].  Return if symptoms worsen or fail to improve.   Take care, Dr Kennyth  PLEASE NOTE:  If you had any lab tests, please let us  know if you have not heard back within a few days. You may see your results on mychart before we have a chance to review them but we will give you a call once they are reviewed by us .   If we ordered any referrals today, please let us  know if you have not heard from their office within the next week.   If you had any urgent prescriptions sent in today, please check with the pharmacy within an hour of our visit to make sure the prescription was transmitted appropriately.   Please try these tips to maintain a healthy lifestyle:  Eat at least 3 REAL meals and 1-2 snacks per day.  Aim for no more than 5 hours between eating.   If you eat breakfast, please do so within one hour of getting up.   Each meal should contain half fruits/vegetables, one quarter protein, and one quarter carbs (no bigger than a computer mouse)  Cut down on sweet beverages. This includes juice, soda, and sweet tea.   Drink at least 1 glass of water with each meal and aim for at least 8 glasses per day  Exercise at least 150 minutes every week.

## 2024-10-25 NOTE — Assessment & Plan Note (Addendum)
 Initially elevated but at goal on recheck.  Continue her regimen per nephrology with benazepril  40 mg daily, Procardia  60 mg daily, and Bystolic  5 mg daily.  She has had some elevations at home recently likely due to recent illness however expect that this will trend back to baseline as she improves.  She will let us  know if she has any persistent elevations at home.

## 2024-10-25 NOTE — Telephone Encounter (Signed)
 Appt today

## 2024-10-25 NOTE — Assessment & Plan Note (Signed)
 We discussed this with her at her most recent visit here a few weeks ago and referred her to PT however she would like to be referred to a physical therapist closer to her house.  Will place this referral today.  She will let us  know if she needs any further assistance.

## 2024-10-28 ENCOUNTER — Ambulatory Visit: Payer: Self-pay | Admitting: Family Medicine

## 2024-10-28 NOTE — Progress Notes (Signed)
 She had protein in her urine however no signs of infection or blood.  She should let us  know if symptoms are not improving.  Do not need to do any other testing at this point.

## 2024-11-04 NOTE — Progress Notes (Signed)
 Her xray shows mild arthritis. This is common and expected for her age group. There were no signs of fracture or other acute issues. She should let us  know if her symptoms are not improving.

## 2024-11-05 ENCOUNTER — Ambulatory Visit: Admitting: Family

## 2024-11-05 ENCOUNTER — Ambulatory Visit: Payer: Self-pay

## 2024-11-05 VITALS — BP 134/80 | HR 68 | Temp 98.3°F | Wt 249.0 lb

## 2024-11-05 DIAGNOSIS — J029 Acute pharyngitis, unspecified: Secondary | ICD-10-CM | POA: Diagnosis not present

## 2024-11-05 MED ORDER — METHYLPREDNISOLONE 4 MG PO TBPK: ORAL_TABLET | ORAL | 0 refills | Status: DC

## 2024-11-05 MED ORDER — PROMETHAZINE-DM 6.25-15 MG/5ML PO SYRP: 5.0000 mL | ORAL_SOLUTION | Freq: Four times a day (QID) | ORAL | 0 refills | Status: DC | PRN

## 2024-11-05 MED ORDER — AZITHROMYCIN 250 MG PO TABS: ORAL_TABLET | ORAL | 0 refills | Status: AC

## 2024-11-05 NOTE — Telephone Encounter (Signed)
 noted

## 2024-11-05 NOTE — Telephone Encounter (Signed)
 FYI Only or Action Required?: FYI only for provider: appointment scheduled on 11/05/2024.  Patient was last seen in primary care on 10/25/2024 by Kennyth Worth HERO, MD.  Called Nurse Triage reporting Sore Throat.  Symptoms began a week ago.  Interventions attempted: Prescription medications: amoxicillin  (AMOXIL ) 500 MG capsule.  Symptoms are: gradually improving.  Triage Disposition: See Physician Within 24 Hours  Patient/caregiver understands and will follow disposition?: Yes         Message from [facility] S sent at 11/05/2024 10:05 AM EST  Reason for Triage: throat is still swollen, still sees white streaks and temp has been 98. Would like a call back  [phone]   Reason for Disposition  [1] Taking antibiotic > 72 hours (3 days) for strep throat AND [2] sore throat not improved  Answer Assessment - Initial Assessment Questions This RN scheduled pt an appointment today at office within preferred group region. Pt aware of location. This RN educated pt on new-worsening symptoms and when to call back. Pt verbalized understanding and agrees to plan.   Pt tested positive for Strep A on 12/12 and was prescribed amoxicillin . Pt's symptoms have improved but she still has the following symptoms:  White streak on right tonsil Left tonsil looks lumpy 2-3/10 sore throat pain level Swelling has gone down some Temp today 98 F, normal temp 97.4 F per pt Cough started last night  Denies: Difficulty breathing  Protocols used: Strep Throat Infection on Antibiotic Follow-up Call-A-[facility]

## 2024-11-05 NOTE — Progress Notes (Signed)
 "  Acute Office Visit  Subjective:     Patient ID: [name], female    DOB: [birth year], 73 y.o.   MRN: [id number]  Chief Complaint  Patient presents with   Sore Throat    Pt tested positive for Strep A on 12/12 and was prescribed amoxicillin . Pt's symptoms have improved but she still has the following symptoms:  White streak on right tonsil Left tonsil looks lumpy 2-3/10 sore throat pain level Swelling has gone down some Temp today 98 F, normal temp 97.4 F per pt Cough started last night      HPI Patient is in today with complaints of sore throat, cough, congestion that has continued.  She was seen on October 25, 2024 and prescribed amoxicillin  for strep throat.  Her symptoms were some better but the symptoms returned.  She now feels like she has white streaks on her tonsils and symptoms are not resolving.  Does admit to not changing out her toothbrush.  Review of Systems  Constitutional:  Negative for fever.  HENT:  Positive for congestion and sore throat.   Respiratory:  Positive for cough.   Cardiovascular: Negative.   Musculoskeletal:  Positive for myalgias.  Neurological: Negative.   Endo/Heme/Allergies: Negative.   Psychiatric/Behavioral: Negative.      Past Medical History:  Diagnosis Date   Anxiety    Arthritis    painful joints (01/21/2015)   GERD (gastroesophageal reflux disease)    Headache    at least q other day lately (01/21/2015)   Heart murmur    Hepatitis B ~ 1992   History of gout    Hypertension    IgAN (IgA nephropathy)    Migraine X 1   Rheumatic fever ~ 1967    Social History   Socioeconomic History   Marital status: Married    Spouse name: Not on file   Number of children: 0   Years of education: Not on file   Highest education level: 12th grade  Occupational History   Occupation: retired  Tobacco Use   Smoking status: Never   Smokeless tobacco: Never  Substance and Sexual Activity   Alcohol use:  Not Currently    Comment: 01/21/2015 1, 12oz beer/month; if that   Drug use: No   Sexual activity: Yes  Other Topics Concern   Not on file  Social History Narrative   Not on file   Social Drivers of Health   Tobacco Use: Low Risk (10/25/2024)   Patient History    Smoking Tobacco Use: Never    Smokeless Tobacco Use: Never    Passive Exposure: Not on file  Financial Resource Strain: Low Risk (09/23/2024)   Overall Financial Resource Strain (CARDIA)    Difficulty of Paying Living Expenses: Not hard at all  Food Insecurity: No Food Insecurity (09/23/2024)   Epic    Worried About Programme Researcher, Broadcasting/film/video in the Last Year: Never true    Ran Out of Food in the Last Year: Never true  Transportation Needs: No Transportation Needs (09/23/2024)   Epic    Lack of Transportation (Medical): No    Lack of Transportation (Non-Medical): No  Physical Activity: Inactive (09/23/2024)   Exercise Vital Sign    Days of Exercise per Week: 0 days    Minutes of Exercise per Session: Not on file  Stress: Stress Concern Present (09/23/2024)   [employer] of Occupational Health - Occupational Stress Questionnaire    Feeling of Stress: To some extent  Social Connections:  Moderately Isolated (09/23/2024)   Social Connection and Isolation Panel    Frequency of Communication with Friends and Family: Three times a week    Frequency of Social Gatherings with Friends and Family: Once a week    Attends Religious Services: Patient declined    Active Member of Clubs or Organizations: No    Attends Engineer, Structural: Not on file    Marital Status: Married  Catering Manager Violence: Not At Risk (05/09/2024)   Epic    Fear of Current or Ex-Partner: No    Emotionally Abused: No    Physically Abused: No    Sexually Abused: No  Depression (PHQ2-9): Low Risk (10/25/2024)   Depression (PHQ2-9)    PHQ-2 Score: 0  Alcohol Screen: Not on file  Housing: Low Risk (09/23/2024)    Epic    Unable to Pay for Housing in the Last Year: No    Number of Times Moved in the Last Year: 0    Homeless in the Last Year: No  Utilities: Not At Risk (05/09/2024)   Epic    Threatened with loss of utilities: No  Health Literacy: Adequate Health Literacy (05/09/2024)   B1300 Health Literacy    Frequency of need for help with medical instructions: Never    Past Surgical History:  Procedure Laterality Date   DILATION AND CURETTAGE OF UTERUS  ~ 1965   ERCP  ~ 1993   LAPAROSCOPIC CHOLECYSTECTOMY  ~ 1983    Family History  Problem Relation Age of Onset   COPD Mother    Kidney disease Father    Heart Problems Brother    Hypertension Brother    Hyperlipidemia Brother    Other Maternal Grandmother        natural causes   Stroke Maternal Grandfather    Other Paternal Grandmother        natural causes   Cancer Paternal Grandfather    Coronary artery disease Other        Half brother    Allergies[1]  Medications Ordered Prior to Encounter[2]  BP 134/80   Pulse 68   Temp 98.3 F (36.8 C) (Temporal)   Wt 249 lb (112.9 kg)   LMP  (LMP Unknown)   SpO2 97%   BMI 43.42 kg/m chart     Objective:    BP 134/80   Pulse 68   Temp 98.3 F (36.8 C) (Temporal)   Wt 249 lb (112.9 kg)   LMP  (LMP Unknown)   SpO2 97%   BMI 43.42 kg/m    Physical Exam Vitals and nursing note reviewed.  Constitutional:      Appearance: She is well-developed and normal weight.  HENT:     Right Ear: Tympanic membrane and ear canal normal.     Left Ear: Tympanic membrane and ear canal normal.     Nose: Congestion present.     Mouth/Throat:     Mouth: Mucous membranes are moist.     Pharynx: Posterior oropharyngeal erythema present. No oropharyngeal exudate.  Cardiovascular:     Rate and Rhythm: Normal rate and regular rhythm.  Pulmonary:     Effort: Pulmonary effort is normal.     Breath sounds: Normal breath sounds.  Skin:    General: Skin is dry.   Neurological:     General: No focal deficit present.     Mental Status: She is alert and oriented to person, place, and time.  Psychiatric:        Mood  and Affect: Mood normal.        Behavior: Behavior normal.    No results found for any visits on 11/05/24.      Assessment & Plan:   Problem List Items Addressed This Visit   None Visit Diagnoses       Pharyngitis, unspecified etiology    -  Primary       Meds ordered this encounter  Medications   methylPREDNISolone  (MEDROL  DOSEPAK) 4 MG TBPK tablet    Sig: As directed    Dispense:  21 tablet    Refill:  0    Supervising Provider:   DOMENICA BLACKBIRD A [[id number]]   promethazine -dextromethorphan (PROMETHAZINE -DM) 6.25-15 MG/5ML syrup    Sig: Take 5 mLs by mouth 4 (four) times daily as needed.    Dispense:  118 mL    Refill:  0    Supervising Provider:   DOMENICA BLACKBIRD A [[id number]]   azithromycin  (ZITHROMAX ) 250 MG tablet    Sig: Take 2 tablets on day 1, then 1 tablet daily on days 2 through 5    Dispense:  6 tablet    Refill:  0    Supervising Provider:   DOMENICA BLACKBIRD A [[id number]]  Call the office if symptoms worsen or persist.  Recheck as scheduled and sooner as needed.  No follow-ups on file.  Twilla Khouri B Kabrea Seeney, FNP       [1] Allergies Allergen Reactions   Clonidine  And Derivatives Other (See Comments)    SEVERE negative reaction, waves of heat over body, very elevated BP   Flagyl [Metronidazole] Nausea And Vomiting   Hydralazine  Other (See Comments)    SEVERE negative reaction   Statins Other (See Comments)    Muscle weakness in arms   Sulfa Antibiotics Rash    Turns red  [2] Current Outpatient Medications on File Prior to Visit  Medication Sig Dispense Refill   benazepril  (LOTENSIN ) 40 MG tablet Take 40 mg by mouth daily.     Coenzyme Q10 (COQ-10) 200 MG CAPS Take 1 capsule by mouth daily.     Cranberry 1000 MG CAPS      glucosamine-chondroitin 500-400 MG tablet Take 2 tablets by mouth 3 (three) times  daily.     LORazepam  (ATIVAN ) 1 MG tablet Take 1 tablet (1 mg total) by mouth every 8 (eight) hours as needed for anxiety. TAKE ONE TABLET BY MOUTH EVERY 8 TO 12 HOURS AS NEEDED 90 tablet 3   losartan  (COZAAR ) 100 MG tablet Take 100 mg by mouth daily.     Multiple Vitamins-Minerals (ZINC PO) Take by mouth.     nebivolol  (BYSTOLIC ) 5 MG tablet      NIFEdipine  (ADALAT  CC) 30 MG 24 hr tablet      Omega-3 Fatty Acids (FISH OIL) 1000 MG CAPS Take 6,000 mg by mouth in the morning and at bedtime.     OVER THE COUNTER MEDICATION Take 1,000 mg by mouth in the morning and at bedtime. Tumeric     vitamin E  400 UNIT capsule Take 400 Units by mouth daily.     No current facility-administered medications on file prior to visit.  "

## 2024-11-08 ENCOUNTER — Ambulatory Visit: Payer: Self-pay

## 2024-11-08 ENCOUNTER — Telehealth: Payer: Self-pay

## 2024-11-08 NOTE — Telephone Encounter (Signed)
 Message from [place] G sent at 11/08/2024  9:24 AM EST  Reason for Triage: Pt requested to speak to NT regarding recent meds prescribed: Azithromycin  250 MG Take, 5methylPREDNISolone 4 MG As directed, Promethazine -DM 6.25-15 MG/5ML 5 mLs, pt is hesitant to take all this meds as she was diagnosed with a kidney disease. Pt also states that provider could not provide a strep test at appt like she asked to.   2nd attempt to contact patient-no answer. Voicemail left for patient to call back to Nurse Triage. Chart Reviewed:see telephone encounter today for details of message left by CMA to patient.

## 2024-11-08 NOTE — Telephone Encounter (Signed)
 Left detailed message that it is ok take the medication that was prescribed. Dr.Kulik also confirmed after looking at lasted renal function test.    Copied from CRM #[id number]. Topic: Clinical - Red Word Triage >> Nov 08, 2024  9:06 AM Jasmin G wrote: [facility] that prompted transfer to Nurse Triage: Pt requested to speak to NT regarding recent meds prescribed: Azithromycin  250 MG Take, 5methylPREDNISolone 4 MG As directed, Promethazine -DM 6.25-15 MG/5ML 5 mLs, pt is hesitant to take all this meds as she was diagnosed with a kidney disease. Pt also states that provider could not provide a strep test at appt like she asked to.

## 2024-11-08 NOTE — Telephone Encounter (Signed)
 FYI

## 2024-11-08 NOTE — Telephone Encounter (Signed)
 Chart reviewed: See telephone encounter today for details of message left by CMA to patient.    Message from [name] sent at 11/08/2024  9:24 AM EST  Reason for Triage: Pt requested to speak to NT regarding recent meds prescribed: Azithromycin  250 MG Take, 5methylPREDNISolone 4 MG As directed, Promethazine -DM 6.25-15 MG/5ML 5 mLs, pt is hesitant to take all this meds as she was diagnosed with a kidney disease. Pt also states that provider could not provide a strep test at appt like she asked to.

## 2024-11-08 NOTE — Telephone Encounter (Signed)
 FYI Only or Action Required?: FYI only for provider: patient was called back and verbalized understanding. She went to UC near her home and was tested negative for strep, which had been her main question at her last appointment.  Patient was last seen in primary care on 11/05/2024 by Douglass Kenney NOVAK, FNP.  Called Nurse Triage reporting Advice Only.  Triage Disposition: Information or Advice Only Call  Patient/caregiver understands and will follow disposition?: Yes  Reason for Disposition  [1] Other NON-URGENT information for PCP AND [2] does not require PCP response  Answer Assessment - Initial Assessment Questions 1. REASON FOR CALL or QUESTION: What is your reason for calling today? or How can I best     Patient states she went to an UC near her home and states she was told the medication was okay to take if she needed it. Patient reports she was tested negative for strep at UC-she was concerned for strep at her last appointment. No further questions.  2. CALLER: Document the source of call. (e.g., laboratory staff, caregiver or patient).     patient  Protocols used: PCP Call - No Triage-A-AH

## 2024-11-19 LAB — BASIC METABOLIC PANEL WITH GFR
BUN: 13 (ref 4–21)
CO2: 23 — AB (ref 13–22)
Chloride: 104 (ref 99–108)
Creatinine: 0.7 (ref 0.5–1.1)
Glucose: 118
Potassium: 4 meq/L (ref 3.5–5.1)
Sodium: 139 (ref 137–147)

## 2024-11-19 LAB — COMPREHENSIVE METABOLIC PANEL WITH GFR
Albumin: 3.8 (ref 3.5–5.0)
Calcium: 10 (ref 8.7–10.7)
Globulin: 3.9
eGFR: 93

## 2024-11-19 LAB — CBC AND DIFFERENTIAL
HCT: 44 (ref 36–46)
Hemoglobin: 14.8 (ref 12.0–16.0)
Platelets: 280 K/uL (ref 150–400)
WBC: 9.6

## 2024-11-19 LAB — CBC: RBC: 4.84 (ref 3.87–5.11)

## 2024-11-19 LAB — HEPATIC FUNCTION PANEL
ALT: 14 U/L (ref 7–35)
AST: 21 (ref 13–35)
Bilirubin, Total: 0.9

## 2024-11-28 ENCOUNTER — Ambulatory Visit: Admitting: Family Medicine

## 2024-11-28 ENCOUNTER — Encounter: Payer: Self-pay | Admitting: Family Medicine

## 2024-11-28 VITALS — BP 138/86 | HR 74 | Temp 98.1°F | Ht 63.5 in | Wt 246.6 lb

## 2024-11-28 DIAGNOSIS — I10 Essential (primary) hypertension: Secondary | ICD-10-CM

## 2024-11-28 DIAGNOSIS — R059 Cough, unspecified: Secondary | ICD-10-CM | POA: Diagnosis not present

## 2024-11-28 DIAGNOSIS — N02B9 Other recurrent and persistent immunoglobulin A nephropathy: Secondary | ICD-10-CM

## 2024-11-28 LAB — POC COVID19 BINAXNOW: SARS Coronavirus 2 Ag: NEGATIVE

## 2024-11-28 LAB — POCT INFLUENZA A/B
Influenza A, POC: NEGATIVE
Influenza B, POC: NEGATIVE

## 2024-11-28 MED ORDER — LEVOFLOXACIN 500 MG PO TABS: 500.0000 mg | ORAL_TABLET | Freq: Every day | ORAL | 0 refills | Status: AC

## 2024-11-28 NOTE — Patient Instructions (Addendum)
 It was very nice to see you today!  VISIT SUMMARY: During your visit, we discussed your persistent cough, recent history of elevated blood pressure, and fluid retention. We reviewed your symptoms and previous treatments, including your recent hospital visits and medications.  YOUR PLAN: PNEUMONIA: You had a recent episode of pneumonia, likely viral, with negative flu and COVID tests. Your CT scan showed some fluid in your lungs. -We prescribed Levaquin  as a backup antibiotic in case your fever or chills return.  FLUID OVERLOAD SECONDARY TO CORTICOSTEROID USE: Your fluid retention and high blood pressure were likely caused by the prednisone  you were taking. -You have stopped taking prednisone . Please monitor for any signs of fluid retention.  HYPERTENSION: Your high blood pressure was likely worsened by the prednisone . -Your blood pressure has normalized after stopping prednisone . Continue to monitor your blood pressure regularly.  Return if symptoms worsen or fail to improve.   Take care, Dr Kennyth  PLEASE NOTE:  If you had any lab tests, please let us  know if you have not heard back within a few days. You may see your results on mychart before we have a chance to review them but we will give you a call once they are reviewed by us .   If we ordered any referrals today, please let us  know if you have not heard from their office within the next week.   If you had any urgent prescriptions sent in today, please check with the pharmacy within an hour of our visit to make sure the prescription was transmitted appropriately.   Please try these tips to maintain a healthy lifestyle:  Eat at least 3 REAL meals and 1-2 snacks per day.  Aim for no more than 5 hours between eating.  If you eat breakfast, please do so within one hour of getting up.   Each meal should contain half fruits/vegetables, one quarter protein, and one quarter carbs (no bigger than a computer mouse)  Cut down on sweet  beverages. This includes juice, soda, and sweet tea.   Drink at least 1 glass of water with each meal and aim for at least 8 glasses per day  Exercise at least 150 minutes every week.

## 2024-11-28 NOTE — Progress Notes (Signed)
 "  [name] is a 74 y.o. female who presents today for an office visit.  Assessment/Plan:  New/Acute Problems: Cough  Symptoms have improved significantly since yesterday, she has had several weeks of cough, congestion, malaise and intermittent fever.  She was in the ED last week with negative workup.  Her CT scan results are not available though they do tell me that she had  fluid buildup but no signs of blood clot.  Based on constellation of symptoms it is concerning that she may have had a viral or atypical pneumonia.  Her COVID and flu test here today were both negative.  It is reassuring that symptoms have improved and she has a reassuring exam today without any signs of respiratory distress.  Hopefully her symptoms will continue to improve however we will send in a pocket prescription for Levaquin  with instruction to not start unless symptoms fail to continue to improve or if she develops any new symptoms.  Chronic Problems Addressed Today: Hypertension Blood pressure at goal today.  Her recent elevation was likely due to the prednisone  use which she has since discontinued.  She did have a sounds like mild fluid overload as well which is likely due to the prednisone .  Given that she is back to baseline do not think we need to do any further workup for her recent fluid retention especially in light of recent prednisone  use.  She will continue her current regimen per nephrology with benazepril  40 mg daily, Procardia  60 mg daily, Bystolic  5 mg daily.  She will let us  know if she has any recurrences of either high blood pressure or fluid retention.      Subjective:  HPI:  See assessment / plan for status of chronic conditions.    Discussed the use of AI scribe software for clinical note transcription with the patient, who gave verbal consent to proceed.  History of Present Illness [name] is a 74 year old female who presents with a persistent cough and recent history  of elevated blood pressure and fluid retention. She is accompanied by her husband, Christopher.  She has been experiencing symptoms for several weeks, including presenting with elevated blood pressure and chest pressure, which led to a visit to an outside Hospital last week. She reported that her blood pressure was elevated, and she experienced chest pressure and shortness of breath. She was told that a CT scan showed a small amount of fluid in her lung. She was prescribed a diuretic, which she decided not to take. She was also on prednisone , which she stopped due to concerns about side effects, including fluid retention and elevated blood pressure.  She has had two rounds of antibiotics, including a Z-Pak, but continues to experience symptoms. Nine days ago, she visited the emergency room where she was tested for influenza and other infections, all of which were negative. She was not given antibiotics at that time. She reports a recent fever of 101F, which broke last night, accompanied by chills and sweating. She continues to experience a persistent cough.  She reports a history of chills and increased heart rate, which she associates with an infection. She has received pneumonia vaccinations in the past. She also notes the presence of 'little white things' in her throat, which she is concerned might indicate strep throat, although she has not been diagnosed with it.         Objective:  Physical Exam: BP 138/86   Pulse 74   Temp 98.1 F (  36.7 C) (Temporal)   Ht 5' 3.5 (1.613 m)   Wt 246 lb 9.6 oz (111.9 kg)   LMP  (LMP Unknown)   SpO2 94%   BMI 43.00 kg/m   Wt Readings from Last 3 Encounters:  11/28/24 246 lb 9.6 oz (111.9 kg)  11/05/24 249 lb (112.9 kg)  10/25/24 246 lb 3.2 oz (111.7 kg)    Gen: No acute distress, resting comfortably CV: Regular rate and rhythm with 3/6 systolic murmur appreciated Pulm: Normal work of breathing, clear to auscultation bilaterally with no crackles,  wheezes, or rhonchi Neuro: Grossly normal, moves all extremities Psych: Normal affect and thought content      Galilea Quito M. Kennyth, MD 11/28/2024 2:03 PM  "

## 2024-11-28 NOTE — Assessment & Plan Note (Signed)
 Blood pressure at goal today.  Her recent elevation was likely due to the prednisone  use which she has since discontinued.  She did have a sounds like mild fluid overload as well which is likely due to the prednisone .  Given that she is back to baseline do not think we need to do any further workup for her recent fluid retention especially in light of recent prednisone  use.  She will continue her current regimen per nephrology with benazepril  40 mg daily, Procardia  60 mg daily, Bystolic  5 mg daily.  She will let us  know if she has any recurrences of either high blood pressure or fluid retention.

## 2024-12-05 ENCOUNTER — Encounter: Payer: Self-pay | Admitting: Family Medicine
